# Patient Record
Sex: Female | Born: 1970 | Race: Black or African American | Hispanic: No | Marital: Married | Smoking: Never smoker
Health system: Southern US, Community
[De-identification: ages and names within clinical notes are randomized; demographics above are authoritative.]

## PROBLEM LIST (undated history)

## (undated) DIAGNOSIS — G4733 Obstructive sleep apnea (adult) (pediatric): Secondary | ICD-10-CM

## (undated) DIAGNOSIS — I420 Dilated cardiomyopathy: Secondary | ICD-10-CM

## (undated) DIAGNOSIS — I10 Essential (primary) hypertension: Secondary | ICD-10-CM

## (undated) DIAGNOSIS — I5021 Acute systolic (congestive) heart failure: Secondary | ICD-10-CM

## (undated) DIAGNOSIS — F419 Anxiety disorder, unspecified: Secondary | ICD-10-CM

## (undated) DIAGNOSIS — I5022 Chronic systolic (congestive) heart failure: Secondary | ICD-10-CM

## (undated) HISTORY — PX: FRACTURE SURGERY: SHX138

## (undated) HISTORY — PX: CHOLECYSTECTOMY: SHX55

## (undated) HISTORY — DX: Chronic systolic (congestive) heart failure: I50.22

## (undated) HISTORY — PX: OOPHORECTOMY: SHX86

## (undated) HISTORY — DX: Obstructive sleep apnea (adult) (pediatric): G47.33

## (undated) HISTORY — DX: Acute systolic (congestive) heart failure: I50.21

## (undated) HISTORY — DX: Dilated cardiomyopathy: I42.0

---

## 2011-11-17 ENCOUNTER — Ambulatory Visit: Payer: Self-pay | Admitting: Internal Medicine

## 2011-11-17 LAB — URINALYSIS, COMPLETE
Bilirubin,UR: NEGATIVE
Glucose,UR: NEGATIVE mg/dL (ref 0–75)
Ketone: NEGATIVE
Nitrite: POSITIVE
Ph: 6 (ref 4.5–8.0)
Protein: 100
Specific Gravity: 1.025 (ref 1.003–1.030)

## 2011-11-19 LAB — URINE CULTURE

## 2011-11-23 ENCOUNTER — Ambulatory Visit: Payer: Self-pay | Admitting: Family Medicine

## 2011-11-23 LAB — URINALYSIS, COMPLETE
Bilirubin,UR: NEGATIVE
Glucose,UR: NEGATIVE mg/dL (ref 0–75)
Ketone: NEGATIVE
Nitrite: NEGATIVE
Ph: 6 (ref 4.5–8.0)
Protein: 30
Specific Gravity: >=1.03 (ref 1.003–1.030)

## 2012-04-24 ENCOUNTER — Ambulatory Visit: Payer: Self-pay | Admitting: Internal Medicine

## 2013-11-20 ENCOUNTER — Ambulatory Visit: Payer: Self-pay | Admitting: Family Medicine

## 2014-01-04 DIAGNOSIS — E669 Obesity, unspecified: Secondary | ICD-10-CM | POA: Insufficient documentation

## 2014-01-04 HISTORY — DX: Obesity, unspecified: E66.9

## 2014-11-15 ENCOUNTER — Ambulatory Visit: Payer: Self-pay | Admitting: Family Medicine

## 2014-11-15 LAB — URINALYSIS, COMPLETE
Bilirubin,UR: NEGATIVE
Glucose,UR: NEGATIVE
Ketone: NEGATIVE
Nitrite: NEGATIVE
Ph: 5.5 (ref 5.0–8.0)
Protein: 100
Specific Gravity: >=1.03 (ref 1.000–1.030)

## 2014-11-19 LAB — URINE CULTURE

## 2015-03-05 ENCOUNTER — Ambulatory Visit
Admission: EM | Admit: 2015-03-05 | Discharge: 2015-03-05 | Disposition: A | Discharge status: Home / Self Care | Payer: BLUE CROSS/BLUE SHIELD | Attending: Family Medicine | Admitting: Family Medicine

## 2015-03-05 DIAGNOSIS — J40 Bronchitis, not specified as acute or chronic: Secondary | ICD-10-CM

## 2015-03-05 HISTORY — DX: Anxiety disorder, unspecified: F41.9

## 2015-03-05 MED ORDER — BENZONATATE 200 MG PO CAPS: 200.0000 mg | ORAL_CAPSULE | Freq: Three times a day (TID) | ORAL | Status: DC | PRN

## 2015-03-05 MED ORDER — AZITHROMYCIN 250 MG PO TABS: ORAL_TABLET | ORAL | Status: DC

## 2015-03-05 NOTE — ED Notes (Signed)
Patient states that she has been coughing. She states that she started getting sick 2 weeks ago. She states that the cough is dry and is almost choking at times.

## 2015-03-05 NOTE — ED Provider Notes (Signed)
CSN: [id number]     Arrival date & time 03/05/15  1532 History   First MD Initiated Contact with Patient 03/05/15 1616     Chief Complaint  Patient presents with  . Cough   Patient is a 44 y.o. female presenting with cough. The history is provided by the patient.  Cough Cough characteristics:  Non-productive Severity:  Moderate Onset quality:  Sudden Timing:  Intermittent Smoker: no   Context: upper respiratory infection (2 weeks ago)   Relieved by:  Cough suppressants (nyquil) Ineffective treatments:  Cough suppressants Associated symptoms: no chest pain, no chills, no ear pain, no fever, no rhinorrhea, no sinus congestion and no wheezing     Past Medical History  Diagnosis Date  . Anxiety    Past Surgical History  Procedure Laterality Date  . Oophorectomy Right    Family History  Problem Relation Age of Onset  . Congestive Heart Failure     History  Substance Use Topics  . Smoking status: Never Smoker   . Smokeless tobacco: Not on file  . Alcohol Use: No   OB History    No data available     Review of Systems  Constitutional: Negative for fever and chills.  HENT: Negative for ear pain and rhinorrhea.   Respiratory: Positive for cough. Negative for wheezing.   Cardiovascular: Negative for chest pain.    Allergies  Penicillins  Home Medications   Prior to Admission medications   Medication Sig Start Date End Date Taking? Authorizing Provider  lamoTRIgine (LAMICTAL) 200 MG tablet Take 300 mg by mouth at bedtime.   Yes Historical Provider, MD  LORazepam (ATIVAN) 0.5 MG tablet Take 0.5 mg by mouth as needed for anxiety.   Yes Historical Provider, MD  azithromycin (ZITHROMAX Z-PAK) 250 MG tablet 2 tabs po once on day 1, then 1 tab po qd for next 4 days 03/05/15   Payton Mccallum, MD  benzonatate (TESSALON) 200 MG capsule Take 1 capsule (200 mg total) by mouth 3 (three) times daily as needed for cough. 03/05/15   Payton Mccallum, MD   BP 136/93 mmHg  Pulse 84   Temp(Src) 97.6 F (36.4 C) (Oral)  Resp 18  Ht  (1.702 m)  Wt 240 lb (108.863 kg)  BMI 37.58 kg/m2  SpO2 98%  LMP 03/05/2015 Physical Exam  Constitutional: She appears well-developed and well-nourished. No distress.  HENT:  Head: Normocephalic and atraumatic.  Right Ear: Tympanic membrane, external ear and ear canal normal.  Left Ear: Tympanic membrane, external ear and ear canal normal.  Nose: Nose normal.  Mouth/Throat: Oropharynx is clear and moist and mucous membranes are normal. No oropharyngeal exudate.  Eyes: Conjunctivae and EOM are normal. Pupils are equal, round, and reactive to light. Right eye exhibits no discharge. Left eye exhibits no discharge. No scleral icterus.  Neck: Normal range of motion. Neck supple. No JVD present. No tracheal deviation present. No thyromegaly present.  Cardiovascular: Normal rate, regular rhythm, normal heart sounds and intact distal pulses.   No murmur heard. Pulmonary/Chest: Effort normal and breath sounds normal. No stridor. No respiratory distress. She has no wheezes. She has no rales.  Lymphadenopathy:    She has no cervical adenopathy.  Neurological: She is alert.  Skin: Skin is warm and dry. No rash noted. She is not diaphoretic. No erythema.  Psychiatric: She has a normal mood and affect. Her behavior is normal. Judgment and thought content normal.  Nursing note and vitals reviewed.   ED Course  Procedures (including critical care time) Labs Review Labs Reviewed - No data to display  Imaging Review No results found.   MDM   1. Bronchitis    Discharge Medication List as of 03/05/2015  4:26 PM    START taking these medications   Details  azithromycin (ZITHROMAX Z-PAK) 250 MG tablet 2 tabs po once on day 1, then 1 tab po qd for next 4 days, Normal    benzonatate (TESSALON) 200 MG capsule Take 1 capsule (200 mg total) by mouth 3 (three) times daily as needed for cough., Starting 03/05/2015, Until Discontinued, Normal       Plan: 1. Diagnosis reviewed with patient 2. rx as per orders; risks, benefits, potential side effects reviewed with patient 3. Recommend supportive treatment with increased fluids 4. F/u prn if symptoms worsen or don't improve    Payton Mccallum, MD 03/05/15 [phone]

## 2015-04-27 DIAGNOSIS — F411 Generalized anxiety disorder: Secondary | ICD-10-CM

## 2015-04-27 HISTORY — DX: Generalized anxiety disorder: F41.1

## 2015-05-02 DIAGNOSIS — I10 Essential (primary) hypertension: Secondary | ICD-10-CM

## 2015-05-02 HISTORY — DX: Essential (primary) hypertension: I10

## 2015-10-03 ENCOUNTER — Ambulatory Visit
Admission: EM | Admit: 2015-10-03 | Discharge: 2015-10-03 | Disposition: A | Discharge status: Home / Self Care | Payer: BLUE CROSS/BLUE SHIELD | Attending: Internal Medicine | Admitting: Internal Medicine

## 2015-10-03 DIAGNOSIS — J3089 Other allergic rhinitis: Secondary | ICD-10-CM | POA: Diagnosis not present

## 2015-10-03 DIAGNOSIS — H6983 Other specified disorders of Eustachian tube, bilateral: Secondary | ICD-10-CM | POA: Diagnosis not present

## 2015-10-03 HISTORY — DX: Essential (primary) hypertension: I10

## 2015-10-03 MED ORDER — FLUTICASONE PROPIONATE 50 MCG/ACT NA SUSP
2.0000 | Freq: Every day | NASAL | Status: DC
Start: 2015-10-03 — End: 2018-08-05

## 2015-10-03 MED ORDER — BENZONATATE 100 MG PO CAPS: 100.0000 mg | ORAL_CAPSULE | Freq: Three times a day (TID) | ORAL | Status: DC | PRN

## 2015-10-03 NOTE — ED Provider Notes (Signed)
CSN: [id number]     Arrival date & time 10/03/15  1520 History   First MD Initiated Contact with Patient 10/03/15 1539     Chief Complaint  Patient presents with  . Nasal Congestion    Pt with one week of URI sx including congestion, blowing bloody yellow-green secretions from nose, and ears ache. Pain in ears 2/10   (Consider location/radiation/quality/duration/timing/severity/associated sxs/prior Treatment) HPI  44 yo f with one week hx cough,cold ,nasal congestion and ear pressure. No fever , no malaise, good appetite. Concerned when she saw spots of blood in nasal discharge. Hx post nasal drip and non productive cough during the Fall. Had bronchitis last Spring and Dx possibility makes her anxious. Discussed viral /allergy component OTC cough/cold therapies not effective. Hx HTN. Currently on menses  Past Medical History  Diagnosis Date  . Anxiety   . HTN (hypertension)    Past Surgical History  Procedure Laterality Date  . Oophorectomy Right   . Cholecystectomy    . Oophorectomy    . Fracture surgery     Family History  Problem Relation Age of Onset  . Congestive Heart Failure     Social History  Substance Use Topics  . Smoking status: Never Smoker   . Smokeless tobacco: None  . Alcohol Use: No   OB History    No data available     Review of Systems Review of 10 systems negative for acute change except as referenced in HPI   Allergies  Penicillins  Home Medications   Prior to Admission medications   Medication Sig Start Date End Date Taking? Authorizing Provider  amLODipine (NORVASC) 10 MG tablet Take 10 mg by mouth daily.   Yes Historical Provider, MD  hydrochlorothiazide (HYDRODIURIL) 25 MG tablet Take 25 mg by mouth daily.   Yes Historical Provider, MD  azithromycin (ZITHROMAX Z-PAK) 250 MG tablet 2 tabs po once on day 1, then 1 tab po qd for next 4 days 03/05/15   Payton Mccallum, MD  benzonatate (TESSALON) 100 MG capsule Take 1 capsule (100 mg total) by  mouth 3 (three) times daily as needed. 10/03/15   Rae Halsted, PA-C  fluticasone [facility]) 50 MCG/ACT nasal spray Place 2 sprays into both nostrils daily. 10/03/15   Rae Halsted, PA-C  lamoTRIgine (LAMICTAL) 200 MG tablet Take 300 mg by mouth at bedtime.    Historical Provider, MD  LORazepam (ATIVAN) 0.5 MG tablet Take 0.5 mg by mouth as needed for anxiety.    Historical Provider, MD   Meds Ordered and Administered this Visit  Medications - No data to display  BP 136/78 mmHg  Pulse 89  Temp(Src) 98.2 F (36.8 C) (Oral)  Resp 20  Ht 5\' 7"  (1.702 m)  Wt 249 lb 6 oz (113.116 kg)  BMI 39.05 kg/m2  SpO2 100%  LMP 10/01/2015 No data found.   Physical Exam   VS noted, WNL  GENERAL : NAD HEENT: no pharyngeal erythema,no exudate, Ear canals neg; no erythema of TMs,mildly retracted w displaced light reflex; pop and squeak with valsalva; Conjugate gaze, no conjunctivitis; No pain w percussion sinues  no cervical LAD RESP: CTA  B , no wheezing, no accessory muscle use CARD: RRR ABD: Not distended NEURO: Good attention, good recall, no gross neuro defecit PSYCH: speech and behavior appropriate  ED Course  Procedures (including critical care time)  Labs Review Labs Reviewed - No data to display  Imaging Review No results found.  Discussed viral syndrome and  seasonal allergies Mucous and eustachian tube dysfunction Nasal membrane trauma , blood spots, minimal concern-add hydration,steam Add Zyrtec, Robitussin DM, Benzonatate, Fluticasone Ibuprofen 600 mg TID with meals for 3 days for discomfort and anti-inflammatory Consider continuing Zyrtec indefinitely if good response,  MDM   1. Eustachian tube dysfunction, bilateral   2. Environmental and seasonal allergies   Diagnosis and treatment discussed.  Questions fielded, expectations and recommendations reviewed. Discussed follow up and return parameters including no resolution or any worsening condition..  Patient expresses  understanding  and agrees to plan. Will return to [facility] with questions, concerns or exacerbation.    Rae Halsted, PA-C 10/08/15 [phone]

## 2015-10-03 NOTE — Discharge Instructions (Signed)
Add Zyrtec (ceterizine ) 1 tablet daily   Flonase  ( fluticasone) 1 spray per nostril per day  Benzonatate for cough  Robitussin DM 1 tsp every 6 hours   Fluids- steam - Barotitis Media Barotitis media is inflammation of your middle ear. This occurs when the auditory tube (eustachian tube) leading from the back of your nose (nasopharynx) to your eardrum is blocked. This blockage may result from a cold, environmental allergies, or an upper respiratory infection. Unresolved barotitis media may lead to damage or hearing loss (barotrauma), which may become permanent. HOME CARE INSTRUCTIONS   Use medicines as recommended by your health care provider. Over-the-counter medicines will help unblock the canal and can help during times of air travel.  Do not put anything into your ears to clean or unplug them. Eardrops will not be helpful.  Do not swim, dive, or fly until your health care provider says it is all right to do so. If these activities are necessary, chewing gum with frequent, forceful swallowing may help. It is also helpful to hold your nose and gently blow to pop your ears for equalizing pressure changes. This forces air into the eustachian tube.  Only take over-the-counter or prescription medicines for pain, discomfort, or fever as directed by your health care provider.  A decongestant may be helpful in decongesting the middle ear and make pressure equalization easier. SEEK MEDICAL CARE IF:  You experience a serious form of dizziness in which you feel as if the room is spinning and you feel nauseated (vertigo).  Your symptoms only involve one ear. SEEK IMMEDIATE MEDICAL CARE IF:   You develop a severe headache, dizziness, or severe ear pain.  You have bloody or pus-like drainage from your ears.  You develop a fever.  Your problems do not improve or become worse. MAKE SURE YOU:   Understand these instructions.  Will watch your condition.  Will get help right away if you  are not doing well or get worse.   This information is not intended to replace advice given to you by your health care provider. Make sure you discuss any questions you have with your health care provider.   Document Released: 09/30/2000 Document Revised: 07/24/2013 Document Reviewed: 04/30/2013 Elsevier Interactive Patient Education [employer]

## 2015-10-08 ENCOUNTER — Encounter: Payer: Self-pay | Admitting: Physician Assistant

## 2016-01-01 DIAGNOSIS — F3170 Bipolar disorder, currently in remission, most recent episode unspecified: Secondary | ICD-10-CM

## 2016-01-01 HISTORY — DX: Bipolar disorder, currently in remission, most recent episode unspecified: F31.70

## 2018-08-05 ENCOUNTER — Other Ambulatory Visit: Payer: Self-pay

## 2018-08-05 ENCOUNTER — Ambulatory Visit
Admission: EM | Admit: 2018-08-05 | Discharge: 2018-08-05 | Disposition: A | Discharge status: Home / Self Care | Payer: BLUE CROSS/BLUE SHIELD | Attending: Emergency Medicine | Admitting: Emergency Medicine

## 2018-08-05 ENCOUNTER — Encounter: Payer: Self-pay | Admitting: Gynecology

## 2018-08-05 DIAGNOSIS — A599 Trichomoniasis, unspecified: Secondary | ICD-10-CM

## 2018-08-05 DIAGNOSIS — N898 Other specified noninflammatory disorders of vagina: Secondary | ICD-10-CM | POA: Diagnosis not present

## 2018-08-05 DIAGNOSIS — Z113 Encounter for screening for infections with a predominantly sexual mode of transmission: Secondary | ICD-10-CM

## 2018-08-05 LAB — URINALYSIS, COMPLETE (UACMP) WITH MICROSCOPIC
Bilirubin Urine: NEGATIVE
Glucose, UA: NEGATIVE mg/dL
Nitrite: NEGATIVE
Protein, ur: 100 mg/dL — AB
Specific Gravity, Urine: 1.025 (ref 1.005–1.030)
pH: 5.5 (ref 5.0–8.0)

## 2018-08-05 LAB — WET PREP, GENITAL
Sperm: NONE SEEN
Yeast Wet Prep HPF POC: NONE SEEN

## 2018-08-05 LAB — CHLAMYDIA/NGC RT PCR (ARMC ONLY)
Chlamydia Tr: NOT DETECTED
N gonorrhoeae: NOT DETECTED

## 2018-08-05 MED ORDER — METRONIDAZOLE 500 MG PO TABS: 500.0000 mg | ORAL_TABLET | Freq: Two times a day (BID) | ORAL | 0 refills | Status: AC

## 2018-08-05 MED ORDER — CEFTRIAXONE SODIUM 250 MG IJ SOLR
250.0000 mg | Freq: Once | INTRAMUSCULAR | Status: AC
  Administered 2018-08-05: 250 mg via INTRAMUSCULAR

## 2018-08-05 MED ORDER — AZITHROMYCIN 500 MG PO TABS
1000.0000 mg | ORAL_TABLET | Freq: Once | ORAL | Status: AC
  Administered 2018-08-05: 1000 mg via ORAL

## 2018-08-05 NOTE — ED Provider Notes (Signed)
HPI  SUBJECTIVE:  Lisa Sellers is a 47 y.o. female who presents with 1 week of thick, white, odorous vaginal discharge, labial irritation and itching.  She used intravaginal and external Monistat with partial improvement in her symptoms, but then reports dysuria and continued irritation, discharge starting several days ago.  No genital rash.  No fevers, pelvic, abdominal, back pain.  No urinary urgency, frequency, cloudy or odorous urine, hematuria.  No recent antibiotics.  She wears non-perfumed panty liners on a regular basis for the past 6 months in case she has accidental urinary incontinence when she coughs or sneezes.  She does wear these while exercising, and states that she does not change out of her wet workout gear immediately after working out.  She is married to a female, who is asymptomatic, STDs are not a concern today.  She states this feels identical to previous yeast infections.  She has a past medical history of UTIs, yeast infections, hypertension that resolved with weight loss.  She is status post right oophorectomy.  No history of pyelonephritis, nephrolithiasis, gonorrhea, chlamydia, HIV, HSV, syphilis, Trichomonas, BV.  LMP: 2 weeks ago.  Denies possibility being pregnant.  WVP:XTGGY, Estill Cotta, MD   Past Medical History:  Diagnosis Date  . Anxiety   . HTN (hypertension)     Past Surgical History:  Procedure Laterality Date  . CHOLECYSTECTOMY    . FRACTURE SURGERY    . OOPHORECTOMY Right   . OOPHORECTOMY      Family History  Problem Relation Age of Onset  . Congestive Heart Failure Unknown     Social History   Tobacco Use  . Smoking status: Never Smoker  . Smokeless tobacco: Never Used  Substance Use Topics  . Alcohol use: No    Alcohol/week: 0.0 standard drinks  . Drug use: No    No current facility-administered medications for this encounter.   Current Outpatient Medications:  .  lamoTRIgine (LAMICTAL) 200 MG tablet, Take 300 mg by mouth at  bedtime., Disp: , Rfl:  .  LORazepam (ATIVAN) 0.5 MG tablet, Take 0.5 mg by mouth as needed for anxiety., Disp: , Rfl:  .  metroNIDAZOLE (FLAGYL) 500 MG tablet, Take 1 tablet (500 mg total) by mouth 2 (two) times daily for 7 days., Disp: 14 tablet, Rfl: 0  Allergies  Allergen Reactions  . Penicillins Hives     ROS  As noted in HPI.   Physical Exam  BP 130/88 (BP Location: Left Arm)   Pulse 80   Temp 97.9 F (36.6 C) (Oral)   Resp 18   Ht 5\' 7"  (1.702 m)   Wt 108.9 kg   LMP 07/22/2018   SpO2 98%   BMI 37.59 kg/m   Constitutional: Well developed, well nourished, no acute distress Eyes:  EOMI, conjunctiva normal bilaterally HENT: Normocephalic, atraumatic,mucus membranes moist Respiratory: Normal inspiratory effort Cardiovascular: Normal rate GI: nondistended soft, nontender. No suprapubic tenderness  GU: External genitalia erythematous, irritated.  No rash or ulcers.  Normal vaginal mucosa.  Normal os. thick  nonoderous  White vaginal discharge.  Uterus smooth,  NT. No CMT. No adnexal tenderness. No adnexal masses.  Chaperone present during exam skin: No rash, skin intact Musculoskeletal: no deformities Neurologic: Alert & oriented x 3, no focal neuro deficits Psychiatric: Speech and behavior appropriate   ED Course   Medications  azithromycin (ZITHROMAX) tablet 1,000 mg (1,000 mg Oral Given 08/05/18 1048)  cefTRIAXone (ROCEPHIN) injection 250 mg (250 mg Intramuscular Given 08/05/18 1050)  Orders Placed This Encounter  Procedures  . Chlamydia/NGC rt PCR    Standing Status:   Standing    Number of Occurrences:   1    Order Specific Question:   Patient immune status    Answer:   Normal  . Wet prep, genital    Standing Status:   Standing    Number of Occurrences:   1    Order Specific Question:   Patient immune status    Answer:   Normal  . Urinalysis, Complete w Microscopic    Standing Status:   Standing    Number of Occurrences:   1  . HIV antibody     Standing Status:   Standing    Number of Occurrences:   1  . RPR    Standing Status:   Standing    Number of Occurrences:   1    Results for orders placed or performed during the hospital encounter of 08/05/18 (from the past 24 hour(s))  Urinalysis, Complete w Microscopic     Status: Abnormal   Collection Time: 08/05/18  9:26 AM  Result Value Ref Range   Color, Urine YELLOW YELLOW   APPearance HAZY (A) CLEAR   Specific Gravity, Urine 1.025 1.005 - 1.030   pH 5.5 5.0 - 8.0   Glucose, UA NEGATIVE NEGATIVE mg/dL   Hgb urine dipstick TRACE (A) NEGATIVE   Bilirubin Urine NEGATIVE NEGATIVE   Ketones, ur TRACE (A) NEGATIVE mg/dL   Protein, ur 119 (A) NEGATIVE mg/dL   Nitrite NEGATIVE NEGATIVE   Leukocytes, UA TRACE (A) NEGATIVE   Squamous Epithelial / LPF 0-5 0 - 5   WBC, UA 11-20 0 - 5 WBC/hpf   RBC / HPF 0-5 0 - 5 RBC/hpf   Bacteria, UA RARE (A) NONE SEEN   Mucus PRESENT    Granular Casts, UA PRESENT   Wet prep, genital     Status: Abnormal   Collection Time: 08/05/18  9:49 AM  Result Value Ref Range   Yeast Wet Prep HPF POC NONE SEEN NONE SEEN   Trich, Wet Prep PRESENT (A) NONE SEEN   Clue Cells Wet Prep HPF POC PRESENT (A) NONE SEEN   WBC, Wet Prep HPF POC FEW (A) NONE SEEN   Sperm NONE SEEN    No results found.  ED Clinical Impression  Trichomonas infection  ED Assessment/Plan  UA noted however do not think that this is from a UTI rather from a vaginal infection. Wet prep positive for trichomonas.  Suspect the clue cells are from the trichomonas, rather than BV, however will send home with a 7-day prescription of Flagyl which will treat both.  Will treat empirically for gonorrhea and chlamydia.  Patient states that she had a rash to penicillin as a baby denies anaphylaxis.  Is willing to try Rocephin after discussing the risks and benefits of getting it.  Will observe after giving Rocephin.  Also checking for HIV, syphilis.  Will send home with Flagyl for the trichomonas.   handwrote a 7-day prescription for her husband as well.  Husband will need to be treated for gonorrhea and chlamydia if her results come back positive.  Consider treating for both even if one is negative.  Follow-up with PMD as needed. Discussed labs, MDM, plan and followup with patient. Pt agrees with plan.   Meds ordered this encounter  Medications  . azithromycin (ZITHROMAX) tablet 1,000 mg  . cefTRIAXone (ROCEPHIN) injection 250 mg  . metroNIDAZOLE (FLAGYL) 500 MG  tablet    Sig: Take 1 tablet (500 mg total) by mouth 2 (two) times daily for 7 days.    Dispense:  14 tablet    Refill:  0    *This clinic note was created using Scientist, clinical (histocompatibility and immunogenetics). Therefore, there may be occasional mistakes despite careful proofreading.  ?    Domenick Gong, MD 08/05/18 1743

## 2018-08-05 NOTE — ED Triage Notes (Signed)
Patient c/o burning with urination/ discharge and vaginal irritation x 1 week.

## 2018-08-05 NOTE — Discharge Instructions (Addendum)
We are treating you empirically for gonorrhea and chlamydia today.  Your partner will need to be treated if your labs come back positive for gonorrhea and chlamydia.  Make sure that he takes the Flagyl as well so that you both get treated.  No alcohol while taking Flagyl and for several days afterwards.

## 2018-08-07 ENCOUNTER — Telehealth: Payer: Self-pay | Admitting: Emergency Medicine

## 2018-08-07 LAB — RPR: RPR Ser Ql: NONREACTIVE

## 2018-08-07 LAB — HIV ANTIBODY (ROUTINE TESTING W REFLEX): HIV Screen 4th Generation wRfx: NONREACTIVE

## 2018-08-07 NOTE — Telephone Encounter (Signed)
Results are within normal range. Pt contacted and made aware. Verbalized understanding.   

## 2019-11-03 ENCOUNTER — Encounter (HOSPITAL_COMMUNITY): Payer: Self-pay | Admitting: Emergency Medicine

## 2019-11-03 ENCOUNTER — Emergency Department (HOSPITAL_COMMUNITY)
Admission: EM | Admit: 2019-11-03 | Discharge: 2019-11-03 | Disposition: A | Discharge status: Home / Self Care | Payer: BC Managed Care – PPO | Attending: Emergency Medicine | Admitting: Emergency Medicine

## 2019-11-03 ENCOUNTER — Emergency Department (HOSPITAL_COMMUNITY): Payer: BC Managed Care – PPO

## 2019-11-03 ENCOUNTER — Other Ambulatory Visit: Payer: Self-pay

## 2019-11-03 DIAGNOSIS — Z20822 Contact with and (suspected) exposure to covid-19: Secondary | ICD-10-CM | POA: Insufficient documentation

## 2019-11-03 DIAGNOSIS — R0602 Shortness of breath: Secondary | ICD-10-CM

## 2019-11-03 DIAGNOSIS — J189 Pneumonia, unspecified organism: Secondary | ICD-10-CM | POA: Diagnosis not present

## 2019-11-03 DIAGNOSIS — Z79899 Other long term (current) drug therapy: Secondary | ICD-10-CM | POA: Insufficient documentation

## 2019-11-03 DIAGNOSIS — I10 Essential (primary) hypertension: Secondary | ICD-10-CM | POA: Diagnosis not present

## 2019-11-03 LAB — CBC WITH DIFFERENTIAL/PLATELET
Abs Immature Granulocytes: 0.03 10*3/uL (ref 0.00–0.07)
Basophils Absolute: 0 10*3/uL (ref 0.0–0.1)
Basophils Relative: 0 %
Eosinophils Absolute: 0.1 10*3/uL (ref 0.0–0.5)
Eosinophils Relative: 1 %
HCT: 33 % — ABNORMAL LOW (ref 36.0–46.0)
Hemoglobin: 9.6 g/dL — ABNORMAL LOW (ref 12.0–15.0)
Immature Granulocytes: 0 %
Lymphocytes Relative: 29 %
Lymphs Abs: 2.6 10*3/uL (ref 0.7–4.0)
MCH: 23.2 pg — ABNORMAL LOW (ref 26.0–34.0)
MCHC: 29.1 g/dL — ABNORMAL LOW (ref 30.0–36.0)
MCV: 79.7 fL — ABNORMAL LOW (ref 80.0–100.0)
Monocytes Absolute: 0.7 10*3/uL (ref 0.1–1.0)
Monocytes Relative: 8 %
Neutro Abs: 5.5 10*3/uL (ref 1.7–7.7)
Neutrophils Relative %: 62 %
Platelets: 388 10*3/uL (ref 150–400)
RBC: 4.14 MIL/uL (ref 3.87–5.11)
RDW: 18.7 % — ABNORMAL HIGH (ref 11.5–15.5)
WBC: 9 10*3/uL (ref 4.0–10.5)
nRBC: 0 % (ref 0.0–0.2)

## 2019-11-03 LAB — BASIC METABOLIC PANEL
Anion gap: 8 (ref 5–15)
BUN: 16 mg/dL (ref 6–20)
CO2: 24 mmol/L (ref 22–32)
Calcium: 8.7 mg/dL — ABNORMAL LOW (ref 8.9–10.3)
Chloride: 106 mmol/L (ref 98–111)
Creatinine, Ser: 0.89 mg/dL (ref 0.44–1.00)
GFR calc Af Amer: >60 mL/min (ref >60)
GFR calc non Af Amer: >60 mL/min (ref >60)
Glucose, Bld: 158 mg/dL — ABNORMAL HIGH (ref 70–99)
Potassium: 3.8 mmol/L (ref 3.5–5.1)
Sodium: 138 mmol/L (ref 135–145)

## 2019-11-03 LAB — RESPIRATORY PANEL BY RT PCR (FLU A&B, COVID)
Influenza A by PCR: NEGATIVE
Influenza B by PCR: NEGATIVE
SARS Coronavirus 2 by RT PCR: NEGATIVE

## 2019-11-03 LAB — POC SARS CORONAVIRUS 2 AG -  ED: SARS Coronavirus 2 Ag: NEGATIVE

## 2019-11-03 MED ORDER — SODIUM CHLORIDE 0.9 % IV BOLUS
1000.0000 mL | Freq: Once | INTRAVENOUS | Status: AC
  Administered 2019-11-03: 1000 mL via INTRAVENOUS

## 2019-11-03 MED ORDER — SODIUM CHLORIDE 0.9 % IV SOLN
1.0000 g | Freq: Once | INTRAVENOUS | Status: AC
  Administered 2019-11-03: 1 g via INTRAVENOUS
  Filled 2019-11-03: qty 10

## 2019-11-03 MED ORDER — DOXYCYCLINE HYCLATE 100 MG PO TABS
100.0000 mg | ORAL_TABLET | Freq: Once | ORAL | Status: AC
  Administered 2019-11-03: 100 mg via ORAL
  Filled 2019-11-03: qty 1

## 2019-11-03 MED ORDER — LEVOFLOXACIN 500 MG PO TABS: 500.0000 mg | ORAL_TABLET | Freq: Every day | ORAL | 0 refills | Status: DC

## 2019-11-03 MED ORDER — LEVOFLOXACIN 750 MG PO TABS
750.0000 mg | ORAL_TABLET | Freq: Once | ORAL | Status: AC
  Administered 2019-11-03: 750 mg via ORAL
  Filled 2019-11-03: qty 1

## 2019-11-03 NOTE — ED Triage Notes (Signed)
Per patient, states she has had a cough and SOB since the 19 th-was seen at [facility] and given a steroid-states she has not gotten better-[facility] returned to [facility] and was told to come to the ED for more testing-has had covid test which was negative

## 2019-11-03 NOTE — Discharge Instructions (Signed)
The x-rays show that you have a community-acquired pneumonia. Your Covid test is negative.  We are starting you on antibiotics.  We recommend that you follow-up with your primary care doctor in 3 to 5 days.  Return to the ER immediately if you start having worsening shortness of breath, high fevers, confusion, fainting.

## 2019-11-03 NOTE — ED Notes (Signed)
An After Visit Summary was printed and given to the patient. Discharge instructions given and no further questions at this time.  

## 2019-11-03 NOTE — ED Provider Notes (Signed)
[facility]-EMERGENCY DEPT Provider Note   CSN: [id number] Arrival date & time: 11/03/19  1546     History Chief Complaint  Patient presents with  . Cough  . Shortness of Breath    Lisa Sellers is a 49 y.o. female with a past medical history of anxiety, hypertension presenting to the ED with a chief complaint of shortness of breath and cough.  Reports since December 29 has been having the similar symptoms.  She was seen and evaluated in urgent care with a negative rapid Covid test and a negative PCR test.  She was given steroids but her symptoms have not improved.  She was told to come to the ED after returning to the urgent care today.  She denies any chest pain, hemoptysis, leg swelling, history of DVT or PE.  HPI     Past Medical History:  Diagnosis Date  . Anxiety   . HTN (hypertension)     There are no problems to display for this patient.   Past Surgical History:  Procedure Laterality Date  . CHOLECYSTECTOMY    . FRACTURE SURGERY    . OOPHORECTOMY Right   . OOPHORECTOMY       OB History   No obstetric history on file.     Family History  Problem Relation Age of Onset  . Congestive Heart Failure Other     Social History   Tobacco Use  . Smoking status: Never Smoker  . Smokeless tobacco: Never Used  Substance Use Topics  . Alcohol use: No    Alcohol/week: 0.0 standard drinks  . Drug use: No    Home Medications Prior to Admission medications   Medication Sig Start Date End Date Taking? Authorizing Provider  lamoTRIgine (LAMICTAL) 200 MG tablet Take 300 mg by mouth at bedtime.    [provider]  LORazepam (ATIVAN) 0.5 MG tablet Take 0.5 mg by mouth as needed for anxiety.    [provider]    Allergies    Penicillins  Review of Systems   Review of Systems  Constitutional: Negative for appetite change, chills and fever.  HENT: Negative for ear pain, rhinorrhea, sneezing and sore throat.   Eyes:  Negative for photophobia and visual disturbance.  Respiratory: Positive for cough and shortness of breath. Negative for chest tightness and wheezing.   Cardiovascular: Negative for chest pain and palpitations.  Gastrointestinal: Negative for abdominal pain, blood in stool, constipation, diarrhea, nausea and vomiting.  Genitourinary: Negative for dysuria, hematuria and urgency.  Musculoskeletal: Negative for myalgias.  Skin: Negative for rash.  Neurological: Negative for dizziness, weakness and light-headedness.    Physical Exam Updated Vital Signs BP (!) 142/101 (BP Location: Left Arm)   Pulse (!) 112   Temp 98.3 F (36.8 C) (Oral)   Resp 18   Ht 5\' 7"  (1.702 m)   Wt 114.8 kg   SpO2 97%   BMI 39.63 kg/m   Physical Exam Vitals and nursing note reviewed.  Constitutional:      General: She is not in acute distress.    Appearance: She is well-developed.  HENT:     Head: Normocephalic and atraumatic.     Nose: Nose normal.  Eyes:     General: No scleral icterus.       Left eye: No discharge.     Conjunctiva/sclera: Conjunctivae normal.  Cardiovascular:     Rate and Rhythm: Regular rhythm. Tachycardia present.     Heart sounds: Normal heart sounds. No  murmur. No friction rub. No gallop.   Pulmonary:     Effort: Pulmonary effort is normal. Tachypnea present. No respiratory distress.     Breath sounds: Normal breath sounds.  Abdominal:     General: Bowel sounds are normal. There is no distension.     Palpations: Abdomen is soft.     Tenderness: There is no abdominal tenderness. There is no guarding.  Musculoskeletal:        General: Normal range of motion.     Cervical back: Normal range of motion and neck supple.     Right lower leg: No tenderness.     Left lower leg: No tenderness.  Skin:    General: Skin is warm and dry.     Findings: No rash.  Neurological:     Mental Status: She is alert.     Motor: No abnormal muscle tone.     Coordination: Coordination normal.       ED Results / Procedures / Treatments   Labs (all labs ordered are listed, but only abnormal results are displayed) Labs Reviewed  BASIC METABOLIC PANEL - Abnormal; Notable for the following components:      Result Value   Glucose, Bld 158 (*)    Calcium 8.7 (*)    All other components within normal limits  CBC WITH DIFFERENTIAL/PLATELET - Abnormal; Notable for the following components:   Hemoglobin 9.6 (*)    HCT 33.0 (*)    MCV 79.7 (*)    MCH 23.2 (*)    MCHC 29.1 (*)    RDW 18.7 (*)    All other components within normal limits  RESPIRATORY PANEL BY RT PCR (FLU A&B, COVID)  POC SARS CORONAVIRUS 2 AG -  ED    EKG None  Radiology DG Chest Port 1 View  Result Date: 11/03/2019 CLINICAL DATA:  Cough and shortness of breath since 10/05/2019 EXAM: PORTABLE CHEST 1 VIEW COMPARISON:  None. FINDINGS: Patchy basilar and peripheral predominant airspace opacities in both lungs with low volumes and atelectatic changes. Mild cardiomegaly, likely accentuated by low volumes and portable technique. No pneumothorax. No visible effusion. No acute osseous or soft tissue abnormality. IMPRESSION: Patchy basilar and peripheral predominant airspace opacities in both lungs. Differential considerations include pneumonia, pulmonary edema, and atelectasis. Electronically Signed   By: Lovena Le M.D.   On: 11/03/2019 17:04    Procedures Procedures (including critical care time)  Medications Ordered in ED Medications  cefTRIAXone (ROCEPHIN) 1 g in sodium chloride 0.9 % 100 mL IVPB (1 g Intravenous New Bag/Given 11/03/19 1737)  sodium chloride 0.9 % bolus 1,000 mL (1,000 mLs Intravenous New Bag/Given 11/03/19 1648)  doxycycline (VIBRA-TABS) tablet 100 mg (100 mg Oral Given 11/03/19 1739)    ED Course  I have reviewed the triage vital signs and the nursing notes.  Pertinent labs & imaging results that were available during my care of the patient were reviewed by me and considered in my medical  decision making (see chart for details).    MDM Rules/Calculators/A&P                      49 year old female presenting to the ED with essentially 2-1/2-week history of cough and shortness of breath.  She has had 2 - Covid test since her symptoms began.  No improvement noted with a steroid course.  She was sent to the ED for further evaluation after returning to urgent care today.  On exam patient is tachycardic, lungs  are clear bilaterally.  Oxygen saturations ranging from 92 to 97% on room air.  She does not appear in respiratory distress but does complain of generalized weakness and fatigue.  Will obtain repeat Covid test, chest x-ray, EKG, baseline lab work and reassess.  Chest x-ray shows basilar and peripheral airspace opacities concerning for pneumonia.  Will give Rocephin, doxycycline, wait on remainder of lab work.  Point-of-care Covid test is negative.  Of note, patient has an allergic reaction to penicillin (rash as a child).  However she has tolerated cephalosporins without difficulty per chart review. Care handed off to Dr. Rhunette Croft pending final disposition.  Final Clinical Impression(s) / ED Diagnoses Final diagnoses:  Shortness of breath    Rx / DC Orders ED Discharge Orders    None      Portions of this note were generated with Dragon dictation software. Dictation errors may occur despite best attempts at proofreading.    Dietrich Pates, PA-C 11/03/19 1745    Derwood Kaplan, MD 11/03/19 2202

## 2019-11-15 ENCOUNTER — Inpatient Hospital Stay (HOSPITAL_COMMUNITY)
Admit: 2019-11-15 | Discharge: 2019-11-15 | Disposition: A | Discharge status: Home / Self Care | Payer: BC Managed Care – PPO | Attending: Internal Medicine | Admitting: Internal Medicine

## 2019-11-15 ENCOUNTER — Emergency Department (HOSPITAL_COMMUNITY): Payer: BC Managed Care – PPO

## 2019-11-15 ENCOUNTER — Inpatient Hospital Stay (HOSPITAL_COMMUNITY): Payer: BC Managed Care – PPO

## 2019-11-15 ENCOUNTER — Inpatient Hospital Stay (HOSPITAL_COMMUNITY)
Admission: EM | Admit: 2019-11-15 | Discharge: 2019-11-19 | DRG: 286 | Disposition: A | Discharge status: Home / Self Care | Payer: BC Managed Care – PPO | Attending: Family Medicine | Admitting: Family Medicine

## 2019-11-15 ENCOUNTER — Other Ambulatory Visit: Payer: Self-pay

## 2019-11-15 ENCOUNTER — Encounter (HOSPITAL_COMMUNITY): Payer: Self-pay

## 2019-11-15 DIAGNOSIS — I5021 Acute systolic (congestive) heart failure: Secondary | ICD-10-CM | POA: Diagnosis present

## 2019-11-15 DIAGNOSIS — I10 Essential (primary) hypertension: Secondary | ICD-10-CM | POA: Diagnosis not present

## 2019-11-15 DIAGNOSIS — G4733 Obstructive sleep apnea (adult) (pediatric): Secondary | ICD-10-CM | POA: Diagnosis present

## 2019-11-15 DIAGNOSIS — R7989 Other specified abnormal findings of blood chemistry: Secondary | ICD-10-CM

## 2019-11-15 DIAGNOSIS — R7303 Prediabetes: Secondary | ICD-10-CM | POA: Diagnosis present

## 2019-11-15 DIAGNOSIS — Z6841 Body Mass Index (BMI) 40.0 and over, adult: Secondary | ICD-10-CM

## 2019-11-15 DIAGNOSIS — I5023 Acute on chronic systolic (congestive) heart failure: Secondary | ICD-10-CM | POA: Diagnosis not present

## 2019-11-15 DIAGNOSIS — Z8249 Family history of ischemic heart disease and other diseases of the circulatory system: Secondary | ICD-10-CM | POA: Diagnosis not present

## 2019-11-15 DIAGNOSIS — Z833 Family history of diabetes mellitus: Secondary | ICD-10-CM

## 2019-11-15 DIAGNOSIS — I428 Other cardiomyopathies: Secondary | ICD-10-CM | POA: Diagnosis present

## 2019-11-15 DIAGNOSIS — I509 Heart failure, unspecified: Secondary | ICD-10-CM

## 2019-11-15 DIAGNOSIS — J810 Acute pulmonary edema: Secondary | ICD-10-CM | POA: Diagnosis not present

## 2019-11-15 DIAGNOSIS — J9601 Acute respiratory failure with hypoxia: Secondary | ICD-10-CM | POA: Diagnosis present

## 2019-11-15 DIAGNOSIS — F419 Anxiety disorder, unspecified: Secondary | ICD-10-CM | POA: Diagnosis present

## 2019-11-15 DIAGNOSIS — I5041 Acute combined systolic (congestive) and diastolic (congestive) heart failure: Secondary | ICD-10-CM | POA: Diagnosis not present

## 2019-11-15 DIAGNOSIS — M7989 Other specified soft tissue disorders: Secondary | ICD-10-CM

## 2019-11-15 DIAGNOSIS — J189 Pneumonia, unspecified organism: Secondary | ICD-10-CM

## 2019-11-15 DIAGNOSIS — Z20822 Contact with and (suspected) exposure to covid-19: Secondary | ICD-10-CM | POA: Diagnosis present

## 2019-11-15 DIAGNOSIS — Z880 Allergy status to penicillin: Secondary | ICD-10-CM | POA: Diagnosis not present

## 2019-11-15 DIAGNOSIS — R0902 Hypoxemia: Secondary | ICD-10-CM

## 2019-11-15 DIAGNOSIS — I130 Hypertensive heart and chronic kidney disease with heart failure and stage 1 through stage 4 chronic kidney disease, or unspecified chronic kidney disease: Principal | ICD-10-CM | POA: Diagnosis present

## 2019-11-15 DIAGNOSIS — I340 Nonrheumatic mitral (valve) insufficiency: Secondary | ICD-10-CM

## 2019-11-15 DIAGNOSIS — R0602 Shortness of breath: Secondary | ICD-10-CM | POA: Diagnosis present

## 2019-11-15 DIAGNOSIS — N181 Chronic kidney disease, stage 1: Secondary | ICD-10-CM | POA: Diagnosis present

## 2019-11-15 HISTORY — DX: Other specified abnormal findings of blood chemistry: R79.89

## 2019-11-15 HISTORY — DX: Heart failure, unspecified: I50.9

## 2019-11-15 LAB — IRON AND TIBC
Iron: 27 ug/dL — ABNORMAL LOW (ref 28–170)
Saturation Ratios: 6 % — ABNORMAL LOW (ref 10.4–31.8)
TIBC: 472 ug/dL — ABNORMAL HIGH (ref 250–450)
UIBC: 445 ug/dL

## 2019-11-15 LAB — ECHOCARDIOGRAM COMPLETE
Height: 67 in
Weight: 4160 oz

## 2019-11-15 LAB — BASIC METABOLIC PANEL
Anion gap: 8 (ref 5–15)
BUN: 14 mg/dL (ref 6–20)
CO2: 25 mmol/L (ref 22–32)
Calcium: 8.6 mg/dL — ABNORMAL LOW (ref 8.9–10.3)
Chloride: 104 mmol/L (ref 98–111)
Creatinine, Ser: 0.91 mg/dL (ref 0.44–1.00)
GFR calc Af Amer: >60 mL/min (ref >60)
GFR calc non Af Amer: >60 mL/min (ref >60)
Glucose, Bld: 169 mg/dL — ABNORMAL HIGH (ref 70–99)
Potassium: 4 mmol/L (ref 3.5–5.1)
Sodium: 137 mmol/L (ref 135–145)

## 2019-11-15 LAB — D-DIMER, QUANTITATIVE: D-Dimer, Quant: 1.45 ug/mL-FEU — ABNORMAL HIGH (ref 0.00–0.50)

## 2019-11-15 LAB — CBC WITH DIFFERENTIAL/PLATELET
Abs Immature Granulocytes: 0.05 10*3/uL (ref 0.00–0.07)
Basophils Absolute: 0 10*3/uL (ref 0.0–0.1)
Basophils Relative: 0 %
Eosinophils Absolute: 0.1 10*3/uL (ref 0.0–0.5)
Eosinophils Relative: 1 %
HCT: 34.1 % — ABNORMAL LOW (ref 36.0–46.0)
Hemoglobin: 9.8 g/dL — ABNORMAL LOW (ref 12.0–15.0)
Immature Granulocytes: 1 %
Lymphocytes Relative: 21 %
Lymphs Abs: 1.7 10*3/uL (ref 0.7–4.0)
MCH: 23.3 pg — ABNORMAL LOW (ref 26.0–34.0)
MCHC: 28.7 g/dL — ABNORMAL LOW (ref 30.0–36.0)
MCV: 81 fL (ref 80.0–100.0)
Monocytes Absolute: 0.9 10*3/uL (ref 0.1–1.0)
Monocytes Relative: 11 %
Neutro Abs: 5.4 10*3/uL (ref 1.7–7.7)
Neutrophils Relative %: 66 %
Platelets: 372 10*3/uL (ref 150–400)
RBC: 4.21 MIL/uL (ref 3.87–5.11)
RDW: 19.4 % — ABNORMAL HIGH (ref 11.5–15.5)
WBC: 8.2 10*3/uL (ref 4.0–10.5)
nRBC: 0.7 % — ABNORMAL HIGH (ref 0.0–0.2)

## 2019-11-15 LAB — POC SARS CORONAVIRUS 2 AG -  ED: SARS Coronavirus 2 Ag: NEGATIVE

## 2019-11-15 LAB — FERRITIN: Ferritin: 11 ng/mL (ref 11–307)

## 2019-11-15 LAB — TSH: TSH: 3.123 u[IU]/mL (ref 0.350–4.500)

## 2019-11-15 LAB — MRSA PCR SCREENING: MRSA by PCR: NEGATIVE

## 2019-11-15 LAB — LACTIC ACID, PLASMA: Lactic Acid, Venous: 1.9 mmol/L (ref 0.5–1.9)

## 2019-11-15 LAB — BRAIN NATRIURETIC PEPTIDE: B Natriuretic Peptide: 383.3 pg/mL — ABNORMAL HIGH (ref 0.0–100.0)

## 2019-11-15 LAB — SARS CORONAVIRUS 2 (TAT 6-24 HRS): SARS Coronavirus 2: NEGATIVE

## 2019-11-15 LAB — CBG MONITORING, ED: Glucose-Capillary: 158 mg/dL — ABNORMAL HIGH (ref 70–99)

## 2019-11-15 MED ORDER — LAMOTRIGINE 100 MG PO TABS
400.0000 mg | ORAL_TABLET | Freq: Every day | ORAL | Status: DC
  Administered 2019-11-15 – 2019-11-18 (×4): 400 mg via ORAL
  Filled 2019-11-15 (×4): qty 4

## 2019-11-15 MED ORDER — GABAPENTIN 100 MG PO CAPS
100.0000 mg | ORAL_CAPSULE | Freq: Two times a day (BID) | ORAL | Status: DC
  Administered 2019-11-15 – 2019-11-19 (×8): 100 mg via ORAL
  Filled 2019-11-15 (×8): qty 1

## 2019-11-15 MED ORDER — IOHEXOL 350 MG/ML SOLN
100.0000 mL | Freq: Once | INTRAVENOUS | Status: AC | PRN
  Administered 2019-11-15: 11:00:00 100 mL via INTRAVENOUS

## 2019-11-15 MED ORDER — CHLORHEXIDINE GLUCONATE CLOTH 2 % EX PADS
6.0000 | MEDICATED_PAD | Freq: Every day | CUTANEOUS | Status: DC
  Administered 2019-11-17 – 2019-11-18 (×2): 6 via TOPICAL

## 2019-11-15 MED ORDER — LABETALOL HCL 5 MG/ML IV SOLN: 10.0000 mg | INTRAVENOUS | Status: DC | PRN

## 2019-11-15 MED ORDER — SODIUM CHLORIDE 0.9 % IV SOLN: INTRAVENOUS | Status: DC

## 2019-11-15 MED ORDER — SODIUM CHLORIDE (PF) 0.9 % IJ SOLN
INTRAMUSCULAR | Status: AC
  Filled 2019-11-15: qty 50

## 2019-11-15 MED ORDER — SODIUM CHLORIDE 0.9 % IV SOLN
1.0000 g | Freq: Once | INTRAVENOUS | Status: AC
  Administered 2019-11-15: 11:00:00 1 g via INTRAVENOUS
  Filled 2019-11-15: qty 10

## 2019-11-15 MED ORDER — ENOXAPARIN SODIUM 60 MG/0.6ML ~~LOC~~ SOLN
60.0000 mg | SUBCUTANEOUS | Status: DC
  Administered 2019-11-15 – 2019-11-17 (×2): 60 mg via SUBCUTANEOUS
  Filled 2019-11-15 (×4): qty 0.6

## 2019-11-15 MED ORDER — SODIUM CHLORIDE 0.9 % IV SOLN: 250.0000 mL | INTRAVENOUS | Status: DC | PRN

## 2019-11-15 MED ORDER — SODIUM CHLORIDE 0.9 % IV SOLN
500.0000 mg | Freq: Once | INTRAVENOUS | Status: AC
  Administered 2019-11-15: 12:00:00 500 mg via INTRAVENOUS
  Filled 2019-11-15: qty 500

## 2019-11-15 MED ORDER — LORAZEPAM 0.5 MG PO TABS: 0.5000 mg | ORAL_TABLET | Freq: Every day | ORAL | Status: DC | PRN

## 2019-11-15 MED ORDER — FUROSEMIDE 10 MG/ML IJ SOLN
60.0000 mg | Freq: Two times a day (BID) | INTRAMUSCULAR | Status: DC
  Administered 2019-11-15 – 2019-11-19 (×7): 60 mg via INTRAVENOUS
  Filled 2019-11-15 (×4): qty 6
  Filled 2019-11-15: qty 8
  Filled 2019-11-15 (×2): qty 6

## 2019-11-15 MED ORDER — SACUBITRIL-VALSARTAN 24-26 MG PO TABS
1.0000 | ORAL_TABLET | Freq: Two times a day (BID) | ORAL | Status: DC
  Administered 2019-11-15: 19:00:00 1 via ORAL
  Filled 2019-11-15: qty 1

## 2019-11-15 MED ORDER — LISINOPRIL 10 MG PO TABS
5.0000 mg | ORAL_TABLET | Freq: Every day | ORAL | Status: DC
  Administered 2019-11-15: 15:00:00 5 mg via ORAL
  Filled 2019-11-15: qty 1

## 2019-11-15 MED ORDER — SODIUM CHLORIDE 0.9% FLUSH: 3.0000 mL | INTRAVENOUS | Status: DC | PRN

## 2019-11-15 MED ORDER — SODIUM CHLORIDE 0.9% FLUSH
3.0000 mL | Freq: Two times a day (BID) | INTRAVENOUS | Status: DC
  Administered 2019-11-16 – 2019-11-19 (×4): 3 mL via INTRAVENOUS

## 2019-11-15 MED ORDER — SACUBITRIL-VALSARTAN 24-26 MG PO TABS
1.0000 | ORAL_TABLET | Freq: Two times a day (BID) | ORAL | Status: DC
  Administered 2019-11-17 – 2019-11-19 (×5): 1 via ORAL
  Filled 2019-11-15 (×6): qty 1

## 2019-11-15 MED ORDER — FUROSEMIDE 10 MG/ML IJ SOLN
40.0000 mg | Freq: Once | INTRAMUSCULAR | Status: AC
  Administered 2019-11-15: 10:00:00 40 mg via INTRAVENOUS
  Filled 2019-11-15: qty 4

## 2019-11-15 MED ORDER — ORAL CARE MOUTH RINSE
15.0000 mL | Freq: Two times a day (BID) | OROMUCOSAL | Status: DC
  Administered 2019-11-15 – 2019-11-19 (×5): 15 mL via OROMUCOSAL

## 2019-11-15 MED ORDER — ASPIRIN 81 MG PO CHEW: 81.0000 mg | CHEWABLE_TABLET | ORAL | Status: AC

## 2019-11-15 MED ORDER — SPIRONOLACTONE 12.5 MG HALF TABLET
12.5000 mg | ORAL_TABLET | Freq: Every day | ORAL | Status: DC
  Administered 2019-11-16 – 2019-11-19 (×4): 12.5 mg via ORAL
  Filled 2019-11-15 (×4): qty 1

## 2019-11-15 NOTE — Plan of Care (Signed)
  Problem: Clinical Measurements: Goal: Will remain free from infection Outcome: Progressing   Problem: Elimination: Goal: Will not experience complications related to bowel motility Outcome: Progressing   

## 2019-11-15 NOTE — Progress Notes (Signed)
Echocardiogram 2D Echocardiogram has been performed.  Lisa Sellers 11/15/2019, 1:55 PM   Notified Dr. Royann Shivers of stat result

## 2019-11-15 NOTE — Progress Notes (Signed)
Bilateral lower extremity venous duplex has been completed. Preliminary results can be found in CV Proc through chart review.   11/15/19 2:01 PM Olen Cordial RVT

## 2019-11-15 NOTE — Progress Notes (Signed)
PHARMACY - ENOXAPARIN  Pharmacy has been asked to adjust enoxaparin dosing as needed this patient for DVT prophylaxis.    Height: 67 inches Weight: 117.9 kg BMI: 40 CrCl ~ 100 ml/min  Assessment:  BMI > 30 and CrCl > 30 ml/min; will adjust Enoxaparin as recommended to 0.5 mg/kg/q24h  Plan: Enoxaparin 60mg  sq q24h for VTE prophylaxis  Thank you , PharmD

## 2019-11-15 NOTE — ED Notes (Addendum)
Patient visibly struggling to breathe while RN was in room and O2 dipped to 88% on RA.  RN put patient on 2L [place], 96% SpO2. Pt states she feels much better with the O2.

## 2019-11-15 NOTE — ED Notes (Signed)
Ultrasound at bedside

## 2019-11-15 NOTE — ED Notes (Signed)
Patient being transported to xray at this time 

## 2019-11-15 NOTE — ED Notes (Signed)
RN spoke to Dr. Renford Dills at this time. Patient OK to go to tele bed.

## 2019-11-15 NOTE — ED Notes (Signed)
RN explained to patient why provider wanted another covid swab and patient declined. RN told Greta Doom, PA-C at this time.

## 2019-11-15 NOTE — Consult Note (Signed)
Cardiology Consultation:   Patient ID: Lisa Sellers MRN: [id number]; DOB: 09/09/1971  Admit date: 11/15/2019 Date of Consult: 11/15/2019  Primary Care Provider: Chung, Aimee B, MD Primary Cardiologist: New Primary Electrophysiologist:  None    Patient Profile:   Lisa Sellers is a 48 y.o. female with a hx of HTN who is being seen today for the evaluation of CHF at the request of Dr Adhikari  History of Present Illness:   Lisa Sellers is a pleasant 48 yo BF with history of HTN but otherwise in good health. She reports first feeling ill on Dec 28 when she developed a cough and SOB. She went to urgent care and treated with steroids. She states she did not improve. She was seen in the ED here on January 22 with cough and SOB. She was afebrile. BP 142/101. CXR felt to be multifocal PNA and she was DC on antibiotics. She states she did seem to get better for a while but over the past 2-3 days has developed worsening cough, SOB and LE edema. No chest pain or palpitations. She is a non smoker and does not drink alcohol or use cocaine. No recent fever or chills. States she is prediabetic. She was on BP therapy in the past but came off medication when she lost weight.   Heart Pathway Score:     Past Medical History:  Diagnosis Date  . Anxiety   . HTN (hypertension)     Past Surgical History:  Procedure Laterality Date  . CHOLECYSTECTOMY    . FRACTURE SURGERY    . OOPHORECTOMY Right   . OOPHORECTOMY       Home Medications:  Prior to Admission medications   Medication Sig Start Date End Date Taking? Authorizing Provider  gabapentin (NEURONTIN) 100 MG capsule Take 100 mg by mouth 2 (two) times daily.   Yes [provider]  guaifenesin (ROBITUSSIN) 100 MG/5ML syrup Take 200 mg by mouth 3 (three) times daily as needed for cough.   Yes [provider]  lamoTRIgine (LAMICTAL) 200 MG tablet Take 400 mg by mouth at bedtime.    Yes [provider]    LORazepam (ATIVAN) 0.5 MG tablet Take 0.5 mg by mouth daily as needed for anxiety.    Yes [provider]  sodium chloride (OCEAN) 0.65 % SOLN nasal spray Place 1 spray into both nostrils as needed for congestion.   Yes [provider]  levofloxacin (LEVAQUIN) 500 MG tablet Take 1 tablet (500 mg total) by mouth daily. 11/04/19   Nanavati, Ankit, MD    Inpatient Medications: Scheduled Meds: . enoxaparin (LOVENOX) injection  60 mg Subcutaneous Q24H  . furosemide  60 mg Intravenous Q12H  . gabapentin  100 mg Oral BID  . lamoTRIgine  400 mg Oral QHS  . lisinopril  5 mg Oral Daily   Continuous Infusions:  PRN Meds: labetalol, LORazepam  Allergies:    Allergies  Allergen Reactions  . Penicillins Hives    Social History:   Social History   Socioeconomic History  . Marital status: Married    Spouse name: Not on file  . Number of children: 1  . Years of education: Not on file  . Highest education level: Not on file  Occupational History  . Not on file  Tobacco Use  . Smoking status: Never Smoker  . Smokeless tobacco: Never Used  Substance and Sexual Activity  . Alcohol use: No    Alcohol/week: 0.0 standard drinks  . Drug   use: No  . Sexual activity: Not on file  Other Topics Concern  . Not on file  Social History Narrative  . Not on file   Social Determinants of Health   Financial Resource Strain:   . Difficulty of Paying Living Expenses: Not on file  Food Insecurity:   . Worried About Running Out of Food in the Last Year: Not on file  . Ran Out of Food in the Last Year: Not on file  Transportation Needs:   . Lack of Transportation (Medical): Not on file  . Lack of Transportation (Non-Medical): Not on file  Physical Activity:   . Days of Exercise per Week: Not on file  . Minutes of Exercise per Session: Not on file  Stress:   . Feeling of Stress : Not on file  Social Connections:   . Frequency of Communication with Friends and Family: Not on  file  . Frequency of Social Gatherings with Friends and Family: Not on file  . Attends Religious Services: Not on file  . Active Member of Clubs or Organizations: Not on file  . Attends Club or Organization Meetings: Not on file  . Marital Status: Not on file  Intimate Partner Violence:   . Fear of Current or Ex-Partner: Not on file  . Emotionally Abused: Not on file  . Physically Abused: Not on file  . Sexually Abused: Not on file    Family History:    Family History  Problem Relation Age of Onset  . Heart failure Father   . Diabetes Father   . Congestive Heart Failure Other      ROS:  Please see the history of present illness.   All other ROS reviewed and negative.     Physical Exam/Data:   Vitals:   11/15/19 1245 11/15/19 1300 11/15/19 1330 11/15/19 1500  BP:  (!) 178/131 (!) 173/140 (!) 142/88  Pulse: 96 95 98 96  Resp: (!) 34 (!) 35 (!) 39 (!) 35  Temp:      TempSrc:      SpO2: 96% 96% 96% 96%  Weight:      Height:       No intake or output data in the 24 hours ending 11/15/19 1520 Last 3 Weights 11/15/2019 11/03/2019 08/05/2018  Weight (lbs) 260 lb 253 lb 240 lb  Weight (kg) 117.935 kg 114.76 kg 108.863 kg     Body mass index is 40.72 kg/m.  General:  Well nourished, well developed, in no acute distress HEENT: normal Lymph: no adenopathy Neck: neck is thick, unable to assess JVD Endocrine:  No thryomegaly Vascular: No carotid bruits; FA pulses 2+ bilaterally without bruits  Cardiac:  RRR with summation gallop, no murmur. No rub.  Lungs:  bibasilar rales  Abd: soft, nontender, no hepatomegaly  Ext: 2+ LE edema Musculoskeletal:  No deformities, BUE and BLE strength normal and equal Skin: warm and dry  Neuro:  CNs 2-12 intact, no focal abnormalities noted Psych:  Normal affect    EKG:  The EKG was personally reviewed and demonstrates:  NSR with prolonged QTc 492 msec. Otherwise normal.  Telemetry:  Telemetry was personally reviewed and demonstrates:   NSR  Relevant CV Studies: Echo today:IMPRESSIONS    1. Left ventricular ejection fraction, by visual estimation, is <20%. The  left ventricle has severely decreased function. There is no left  ventricular hypertrophy.  2. Elevated left atrial pressure.  3. Left ventricular diastolic parameters are consistent with Grade II  diastolic dysfunction (pseudonormalization).    4. Moderately dilated left ventricular internal cavity size.  5. The left ventricle demonstrates global hypokinesis.  6. Global right ventricle has mildly reduced systolic function.The right  ventricular size is normal. No increase in right ventricular wall  thickness.  7. Left atrial size was moderately dilated.  8. The pericardial effusion is circumferential.  9. Trivial pericardial effusion is present.  10. The mitral valve is normal in structure. Mild mitral valve  regurgitation.  11. The tricuspid valve is normal in structure. Tricuspid valve  regurgitation is trivial.  12. The aortic valve is normal in structure. Aortic valve regurgitation is  not visualized.  13. The pulmonic valve was normal in structure. Pulmonic valve  regurgitation is not visualized.  14. TR signal is inadequate for assessing pulmonary artery systolic  pressure.  15. The inferior vena cava is dilated in size with <50% respiratory  variability, suggesting right atrial pressure of 15 mmHg.  16. The average left ventricular global longitudinal strain is -6.6 %.  17. Right atrial size was normal.   LE venous dopplers negative for DVT.     Laboratory Data:  High Sensitivity Troponin:  No results for input(s): TROPONINIHS in the last 720 hours.   Chemistry Recent Labs  Lab 11/15/19 0749  NA 137  K 4.0  CL 104  CO2 25  GLUCOSE 169*  BUN 14  CREATININE 0.91  CALCIUM 8.6*  GFRNONAA >60  GFRAA >60  ANIONGAP 8    No results for input(s): PROT, ALBUMIN, AST, ALT, ALKPHOS, BILITOT in the last 168 hours. HematologyNo  results for input(s): WBC, RBC, HGB, HCT, MCV, MCH, MCHC, RDW, PLT in the last 168 hours. BNP Recent Labs  Lab 11/15/19 0749  BNP 383.3*    DDimer  Recent Labs  Lab 11/15/19 0749  DDIMER 1.45*     Radiology/Studies:  DG Chest 2 View  Result Date: 11/15/2019 CLINICAL DATA:  Recent pneumonia.  Shortness of breath. EXAM: CHEST - 2 VIEW COMPARISON:  11/03/2019 FINDINGS: Grossly unchanged enlarged cardiac silhouette and mediastinal contours. Progressive rather diffuse though perihilar predominant interstitial thickening. Minimal bibasilar heterogeneous opacities favored to represent atelectasis. No definite pleural effusion or pneumothorax. No acute osseous abnormalities. IMPRESSION: Progressive bilateral perihilar interstitial opacities, potentially pulmonary edema though atypical infection (including COVID-19) could have a similar appearance. Electronically Signed   By: John  Watts M.D.   On: 11/15/2019 07:44   CT Angio Chest PE W and/or Wo Contrast  Result Date: 11/15/2019 CLINICAL DATA:  Onset shortness of breath yesterday. Oxygen desaturation. The patient reports she was discharged from the hospital 11/08/2019 after admission for pneumonia. EXAM: CT ANGIOGRAPHY CHEST WITH CONTRAST TECHNIQUE: Multidetector CT imaging of the chest was performed using the standard protocol during bolus administration of intravenous contrast. Multiplanar CT image reconstructions and MIPs were obtained to evaluate the vascular anatomy. CONTRAST:  100 mL OMNIPAQUE IOHEXOL 350 MG/ML SOLN COMPARISON:  Single-view of the chest 11/03/2019. PA and lateral chest today. FINDINGS: Cardiovascular: No pulmonary embolus is identified. There is marked cardiomegaly. Minimal aortic atherosclerosis is seen. No aortic aneurysm or pericardial effusion. Mediastinum/Nodes: No enlarged mediastinal, hilar, or axillary lymph nodes. Thyroid gland, trachea, and esophagus demonstrate no significant findings. Lungs/Pleura: Small to moderate  bilateral pleural effusions are present, greater on the right. There is scattered interlobular septal thickening and ground-glass attenuation. Mild basilar atelectasis noted. Upper Abdomen: Status post cholecystectomy. No acute or focal abnormality. Musculoskeletal: No lytic or sclerotic lesion. No acute bony abnormality. Review of the MIP images confirms the above   findings. IMPRESSION: Negative for pulmonary embolus. Findings consistent with pulmonary edema with associated small to moderate pleural effusions, larger on the right. Marked cardiomegaly also noted. Mild aortic atherosclerosis (ICD10-I70.0). Electronically Signed   By: Thomas  Dalessio M.D.   On: 11/15/2019 11:30   ECHOCARDIOGRAM COMPLETE  Result Date: 11/15/2019   ECHOCARDIOGRAM REPORT   Patient Name:   Lisa Sellers Date of Exam: 11/15/2019 Medical Rec #:  [id number]               Height:       67.0 in Accession #:    [id number]              Weight:       260.0 lb Date of Birth:  10/10/1971               BSA:          2.26 m Patient Age:    48 years                BP:           178/131 mmHg Patient Gender: F                       HR:           98 bpm. Exam Location:  Inpatient Procedure: 2D Echo, 3D Echo, Color Doppler, Cardiac Doppler and Strain Analysis Indications:    R06.9 DOE  History:        Patient has prior history of Echocardiogram examinations, most                 recent 04/21/2018. CHF; Risk Factors:Hypertension. Prior echo                 performed at [facility].  Sonographer:    Emily Senior RDCS Referring Phys: [id number] AMRIT ADHIKARI IMPRESSIONS  1. Left ventricular ejection fraction, by visual estimation, is <20%. The left ventricle has severely decreased function. There is no left ventricular hypertrophy.  2. Elevated left atrial pressure.  3. Left ventricular diastolic parameters are consistent with Grade II diastolic dysfunction (pseudonormalization).  4. Moderately dilated left ventricular internal cavity size.  5. The left  ventricle demonstrates global hypokinesis.  6. Global right ventricle has mildly reduced systolic function.The right ventricular size is normal. No increase in right ventricular wall thickness.  7. Left atrial size was moderately dilated.  8. The pericardial effusion is circumferential.  9. Trivial pericardial effusion is present. 10. The mitral valve is normal in structure. Mild mitral valve regurgitation. 11. The tricuspid valve is normal in structure. Tricuspid valve regurgitation is trivial. 12. The aortic valve is normal in structure. Aortic valve regurgitation is not visualized. 13. The pulmonic valve was normal in structure. Pulmonic valve regurgitation is not visualized. 14. TR signal is inadequate for assessing pulmonary artery systolic pressure. 15. The inferior vena cava is dilated in size with <50% respiratory variability, suggesting right atrial pressure of 15 mmHg. 16. The average left ventricular global longitudinal strain is -6.6 %. 17. Right atrial size was normal. FINDINGS  Left Ventricle: Left ventricular ejection fraction, by visual estimation, is <20%. The left ventricle has severely decreased function. The average left ventricular global longitudinal strain is -6.6 %. The left ventricle demonstrates global hypokinesis.  The left ventricular internal cavity size was moderately dilated left ventricle. There is no left ventricular hypertrophy. Left ventricular diastolic parameters are consistent with Grade II diastolic dysfunction (pseudonormalization). Elevated left atrial   pressure. Right Ventricle: The right ventricular size is normal. No increase in right ventricular wall thickness. Global RV systolic function is has mildly reduced systolic function. Left Atrium: Left atrial size was moderately dilated. Right Atrium: Right atrial size was normal in size Pericardium: Trivial pericardial effusion is present. The pericardial effusion is circumferential. Mitral Valve: The mitral valve is normal in  structure. Mild mitral valve regurgitation, with centrally-directed jet. Tricuspid Valve: The tricuspid valve is normal in structure. Tricuspid valve regurgitation is trivial. Aortic Valve: The aortic valve is normal in structure. Aortic valve regurgitation is not visualized. Pulmonic Valve: The pulmonic valve was normal in structure. Pulmonic valve regurgitation is not visualized. Pulmonic regurgitation is not visualized. Aorta: The aortic root and ascending aorta are structurally normal, with no evidence of dilitation. Venous: The inferior vena cava is dilated in size with less than 50% respiratory variability, suggesting right atrial pressure of 15 mmHg. IAS/Shunts: No atrial level shunt detected by color flow Doppler.  LEFT VENTRICLE PLAX 2D LVIDd:         6.20 cm       Diastology LVIDs:         5.90 cm       LV e' lateral:   5.66 cm/s LV PW:         1.30 cm       LV E/e' lateral: 17.0 LV IVS:        1.00 cm       LV e' medial:    6.85 cm/s LVOT diam:     2.00 cm       LV E/e' medial:  14.1 LV SV:         21 ml LV SV Index:   8.59          2D Longitudinal Strain LVOT Area:     3.14 cm      2D Strain GLS Avg:     -6.6 %  LV Volumes (MOD) LV area d, A2C:    46.30 cm LV area d, A4C:    43.00 cm LV area s, A2C:    42.50 cm LV area s, A4C:    36.00 cm LV major d, A2C:   9.54 cm LV major d, A4C:   9.83 cm LV major s, A2C:   9.38 cm LV major s, A4C:   9.22 cm LV vol d, MOD A2C: 190.0 ml LV vol d, MOD A4C: 160.0 ml LV vol s, MOD A2C: 162.0 ml LV vol s, MOD A4C: 126.0 ml LV SV MOD A2C:     28.0 ml LV SV MOD A4C:     160.0 ml LV SV MOD BP:      33.8 ml RIGHT VENTRICLE RV S prime:     8.92 cm/s TAPSE (M-mode): 2.1 cm LEFT ATRIUM             Index       RIGHT ATRIUM           Index LA diam:        4.10 cm 1.81 cm/m  RA Area:     15.40 cm LA Vol (A2C):   86.1 ml 38.08 ml/m RA Volume:   35.10 ml  15.53 ml/m LA Vol (A4C):   81.6 ml 36.09 ml/m LA Biplane Vol: 85.6 ml 37.86 ml/m  AORTIC VALVE LVOT Vmax:   47.00 cm/s  LVOT Vmean:  32.500 cm/s LVOT VTI:    0.066 m  AORTA Ao Root diam: 2.70 cm Ao Asc   diam:  3.10 cm MITRAL VALVE MV Area (PHT): 4.21 cm             SHUNTS MV PHT:        52.20 msec           Systemic VTI:  0.07 m MV Decel Time: 180 msec             Systemic Diam: 2.00 cm MV E velocity: 96.40 cm/s 103 cm/s MV A velocity: 28.70 cm/s 70.3 cm/s MV E/A ratio:  3.36       1.5  Mihai Croitoru MD Electronically signed by Mihai Croitoru MD Signature Date/Time: 11/15/2019/2:13:57 PM    Final    VAS US LOWER EXTREMITY VENOUS (DVT)  Result Date: 11/15/2019  Lower Venous Study Indications: Swelling.  Risk Factors: None identified. Limitations: Body habitus and poor ultrasound/tissue interface. Comparison Study: No prior studies. Performing Technologist: Gregory Collins RVT  Examination Guidelines: A complete evaluation includes B-mode imaging, spectral Doppler, color Doppler, and power Doppler as needed of all accessible portions of each vessel. Bilateral testing is considered an integral part of a complete examination. Limited examinations for reoccurring indications may be performed as noted.  +---------+---------------+---------+-----------+----------+--------------+ RIGHT    CompressibilityPhasicitySpontaneityPropertiesThrombus Aging +---------+---------------+---------+-----------+----------+--------------+ CFV      Full           Yes      Yes                                 +---------+---------------+---------+-----------+----------+--------------+ SFJ      Full                                                        +---------+---------------+---------+-----------+----------+--------------+ FV Prox  Full                                                        +---------+---------------+---------+-----------+----------+--------------+ FV Mid   Full                                                        +---------+---------------+---------+-----------+----------+--------------+ FV  DistalFull                                                        +---------+---------------+---------+-----------+----------+--------------+ PFV      Full                                                        +---------+---------------+---------+-----------+----------+--------------+ POP      Full           Yes      Yes                                 +---------+---------------+---------+-----------+----------+--------------+   PTV      Full                                                        +---------+---------------+---------+-----------+----------+--------------+ PERO     Full                                                        +---------+---------------+---------+-----------+----------+--------------+   +---------+---------------+---------+-----------+----------+--------------+ LEFT     CompressibilityPhasicitySpontaneityPropertiesThrombus Aging +---------+---------------+---------+-----------+----------+--------------+ CFV      Full           Yes      Yes                                 +---------+---------------+---------+-----------+----------+--------------+ SFJ      Full                                                        +---------+---------------+---------+-----------+----------+--------------+ FV Prox  Full                                                        +---------+---------------+---------+-----------+----------+--------------+ FV Mid   Full                                                        +---------+---------------+---------+-----------+----------+--------------+ FV DistalFull                                                        +---------+---------------+---------+-----------+----------+--------------+ PFV      Full                                                        +---------+---------------+---------+-----------+----------+--------------+ POP      Full           Yes      Yes                                  +---------+---------------+---------+-----------+----------+--------------+ PTV      Full                                                        +---------+---------------+---------+-----------+----------+--------------+   PERO     Full                                                        +---------+---------------+---------+-----------+----------+--------------+     Summary: Right: There is no evidence of deep vein thrombosis in the lower extremity. No cystic structure found in the popliteal fossa. Left: There is no evidence of deep vein thrombosis in the lower extremity. No cystic structure found in the popliteal fossa.  *See table(s) above for measurements and observations.    Preliminary          Assessment and Plan:   1. Acute systolic CHF. Patient presents with one month history of dyspnea and cough and now edema. EF 15% by Echo with severe global HK. BNP 383. CT c/w pulmonary edema and small pleural effusions.  Most likely this is related to untreated HTN although could be viral mediated or familial. Recommend IV diuresis with lasix 40 mg bid. Strict I/O, daily weight. Sodium restriction. Will add Entresto and spironolactone. Once volume status is improved would add Coreg. Titrate meds as tolerated. Recommend right and left heart cath on Monday if stable.  2. HTN uncontrolled. Will monitor response to above medication.        For questions or updates, please contact [facility] Please consult [email or web address] for contact info under     Signed, Lynnae Ludemann, MD  11/15/2019 3:20 PM 

## 2019-11-15 NOTE — ED Notes (Signed)
Patient given bedside commode at this time. CT on the way to get patient for scan... RN will give IV antibiotics when patient returns from CT.

## 2019-11-15 NOTE — Plan of Care (Signed)
  Problem: Safety: Goal: Ability to remain free from injury will improve Outcome: Progressing   Problem: Education: Goal: Ability to verbalize understanding of medication therapies will improve Outcome: Progressing   Problem: Coping: Goal: Level of anxiety will decrease Outcome: Progressing

## 2019-11-15 NOTE — H&P (Signed)
History and Physical    Lisa Sellers BPZ:[id number] DOB: Aug 21, 1971 DOA: 11/15/2019  PCP: Catha Gosselin, MD   Patient coming from: Home    Chief Complaint: Shortness of breath, orthopnea, cough  HPI: Lisa Sellers is a 49 y.o. female with medical history significant of hypertension, anxiety disorder, morbid obesity, borderline diabetes who presents from home to the emergency department with complaints of persistent shortness of breath, lower extremity edema, orthopnea.  She has history of hypertension but apparently is not taking any antihypertensive medicines at home.  Patient started feeling short of breath, started coughing about a month ago on 29 December.  She was seen at urgent care given steroid tablets after she was diagnosed with upper respiratory infection.  That did not help.  She was seen in the emergency department here about 10 days ago, chest x-ray at that time showed multifocal pneumonia and she was discharged on Levaquin.  She has been tested multiple times for Covid recently and all of them are negative.  She felt little better after she took Levaquin but since yesterday she again  started having shortness of breath and now orthopneic.  She is also coughing and bringing some phlegm.  She has noticed that her bilateral lower extremities are swollen.  Patient lives with her family.  No history of Covid exposure. Patient seen and examined at the bedside in the emergency department.  She was hypertensive.  She was not in any respiratory distress and states she is feeling slightly better now.  She denies any fever, chills, chest pain, abdomen, nausea, vomiting, diarrhea, dysuria or headache.   ED Course: Chest x-ray done in the emergency department showed bilateral perihilar interstitial opacities suggesting pulmonary edema versus atypical infection.  CT angio of the chest showed pulmonary edema, small-to-moderate bilateral pleural effusion.  Covid screen test is  pending. E KG showed sinus rhythm.  BNP limited  Review of Systems: As per HPI otherwise 10 point review of systems negative.    Past Medical History:  Diagnosis Date  . Anxiety   . HTN (hypertension)     Past Surgical History:  Procedure Laterality Date  . CHOLECYSTECTOMY    . FRACTURE SURGERY    . OOPHORECTOMY Right   . OOPHORECTOMY       reports that she has never smoked. She has never used smokeless tobacco. She reports that she does not drink alcohol or use drugs.  Allergies  Allergen Reactions  . Penicillins Hives    Family History  Problem Relation Age of Onset  . Congestive Heart Failure Other      Prior to Admission medications   Medication Sig Start Date End Date Taking? Authorizing Provider  gabapentin (NEURONTIN) 100 MG capsule Take 100 mg by mouth 2 (two) times daily.   Yes [provider]  guaifenesin (ROBITUSSIN) 100 MG/5ML syrup Take 200 mg by mouth 3 (three) times daily as needed for cough.   Yes [provider]  lamoTRIgine (LAMICTAL) 200 MG tablet Take 400 mg by mouth at bedtime.    Yes [provider]  LORazepam (ATIVAN) 0.5 MG tablet Take 0.5 mg by mouth daily as needed for anxiety.    Yes [provider]  sodium chloride (OCEAN) 0.65 % SOLN nasal spray Place 1 spray into both nostrils as needed for congestion.   Yes [provider]  levofloxacin (LEVAQUIN) 500 MG tablet Take 1 tablet (500 mg total) by mouth daily. 11/04/19   Derwood Kaplan, MD  Physical Exam: Vitals:   11/15/19 1000 11/15/19 1110 11/15/19 1120 11/15/19 1130  BP: (!) 162/120 (!) 192/130  (!) 169/128  Pulse: 93  94 98  Resp: (!) 33 (!) 30 (!) 29 (!) 28  Temp:      TempSrc:      SpO2: 98%  97% 96%  Weight:      Height:        Constitutional: Not in distress, morbidly obese Vitals:   11/15/19 1000 11/15/19 1110 11/15/19 1120 11/15/19 1130  BP: (!) 162/120 (!) 192/130  (!) 169/128  Pulse: 93  94 98  Resp: (!) 33 (!) 30 (!) 29  (!) 28  Temp:      TempSrc:      SpO2: 98%  97% 96%  Weight:      Height:       Eyes: PERRL, lids and conjunctivae normal Neck: normal, supple, no masses, no thyromegaly Respiratory: Decreased air entry in the bases, faint crackles normal respiratory effort. No accessory muscle use.  Cardiovascular: Hypertensive, regular rate and rhythm, no murmurs / rubs / gallops.  2-3+ bilateral lower extremity edema Abdomen: no tenderness, no masses palpated. No hepatosplenomegaly. Bowel sounds positive.  Musculoskeletal: no clubbing / cyanosis. No joint deformity upper and lower extremities. Good ROM, no contractures. Normal muscle tone.  Skin: no rashes, lesions, ulcers. No induration Neurologic: CN 2-12 grossly intact. Strength 5/5 in all 4.  Psychiatric: Normal judgment and insight. Alert and oriented x 3. Normal mood.   Foley Catheter:None  Labs on Admission: I have personally reviewed following labs and imaging studies  CBC: No results for input(s): WBC, NEUTROABS, HGB, HCT, MCV, PLT in the last 168 hours. Basic Metabolic Panel: Recent Labs  Lab 11/15/19 0749  NA 137  K 4.0  CL 104  CO2 25  GLUCOSE 169*  BUN 14  CREATININE 0.91  CALCIUM 8.6*   GFR: Estimated Creatinine Clearance: 100.4 mL/min (by C-G formula based on SCr of 0.91 mg/dL). Liver Function Tests: No results for input(s): AST, ALT, ALKPHOS, BILITOT, PROT, ALBUMIN in the last 168 hours. No results for input(s): LIPASE, AMYLASE in the last 168 hours. No results for input(s): AMMONIA in the last 168 hours. Coagulation Profile: No results for input(s): INR, PROTIME in the last 168 hours. Cardiac Enzymes: No results for input(s): CKTOTAL, CKMB, CKMBINDEX, TROPONINI in the last 168 hours. BNP (last 3 results) No results for input(s): PROBNP in the last 8760 hours. HbA1C: No results for input(s): HGBA1C in the last 72 hours. CBG: Recent Labs  Lab 11/15/19 0627  GLUCAP 158*   Lipid Profile: No results for  input(s): CHOL, HDL, LDLCALC, TRIG, CHOLHDL, LDLDIRECT in the last 72 hours. Thyroid Function Tests: No results for input(s): TSH, T4TOTAL, FREET4, T3FREE, THYROIDAB in the last 72 hours. Anemia Panel: No results for input(s): VITAMINB12, FOLATE, FERRITIN, TIBC, IRON, RETICCTPCT in the last 72 hours. Urine analysis:    Component Value Date/Time   COLORURINE YELLOW 08/05/2018 0926   APPEARANCEUR HAZY (A) 08/05/2018 0926   APPEARANCEUR HAZY 11/15/2014 1452   LABSPEC 1.025 08/05/2018 0926   LABSPEC >=1.030 11/15/2014 1452   PHURINE 5.5 08/05/2018 0926   GLUCOSEU NEGATIVE 08/05/2018 0926   GLUCOSEU NEGATIVE 11/15/2014 1452   HGBUR TRACE (A) 08/05/2018 0926   BILIRUBINUR NEGATIVE 08/05/2018 0926   BILIRUBINUR NEGATIVE 11/15/2014 1452   KETONESUR TRACE (A) 08/05/2018 0926   PROTEINUR 100 (A) 08/05/2018 0926   NITRITE NEGATIVE 08/05/2018 0926   LEUKOCYTESUR TRACE (A) 08/05/2018 4034  LEUKOCYTESUR TRACE 11/15/2014 1452    Radiological Exams on Admission: DG Chest 2 View  Result Date: 11/15/2019 CLINICAL DATA:  Recent pneumonia.  Shortness of breath. EXAM: CHEST - 2 VIEW COMPARISON:  11/03/2019 FINDINGS: Grossly unchanged enlarged cardiac silhouette and mediastinal contours. Progressive rather diffuse though perihilar predominant interstitial thickening. Minimal bibasilar heterogeneous opacities favored to represent atelectasis. No definite pleural effusion or pneumothorax. No acute osseous abnormalities. IMPRESSION: Progressive bilateral perihilar interstitial opacities, potentially pulmonary edema though atypical infection (including COVID-19) could have a similar appearance. Electronically Signed   By: Simonne Come M.D.   On: 11/15/2019 07:44   CT Angio Chest PE W and/or Wo Contrast  Result Date: 11/15/2019 CLINICAL DATA:  Onset shortness of breath yesterday. Oxygen desaturation. The patient reports she was discharged from the hospital 11/08/2019 after admission for pneumonia. EXAM: CT  ANGIOGRAPHY CHEST WITH CONTRAST TECHNIQUE: Multidetector CT imaging of the chest was performed using the standard protocol during bolus administration of intravenous contrast. Multiplanar CT image reconstructions and MIPs were obtained to evaluate the vascular anatomy. CONTRAST:  100 mL OMNIPAQUE IOHEXOL 350 MG/ML SOLN COMPARISON:  Single-view of the chest 11/03/2019. PA and lateral chest today. FINDINGS: Cardiovascular: No pulmonary embolus is identified. There is marked cardiomegaly. Minimal aortic atherosclerosis is seen. No aortic aneurysm or pericardial effusion. Mediastinum/Nodes: No enlarged mediastinal, hilar, or axillary lymph nodes. Thyroid gland, trachea, and esophagus demonstrate no significant findings. Lungs/Pleura: Small to moderate bilateral pleural effusions are present, greater on the right. There is scattered interlobular septal thickening and ground-glass attenuation. Mild basilar atelectasis noted. Upper Abdomen: Status post cholecystectomy. No acute or focal abnormality. Musculoskeletal: No lytic or sclerotic lesion. No acute bony abnormality. Review of the MIP images confirms the above findings. IMPRESSION: Negative for pulmonary embolus. Findings consistent with pulmonary edema with associated small to moderate pleural effusions, larger on the right. Marked cardiomegaly also noted. Mild aortic atherosclerosis (ICD10-I70.0). Electronically Signed   By: Drusilla Kanner M.D.   On: 11/15/2019 11:30     Assessment/Plan Principal Problem:   Acute CHF (congestive heart failure) (HCC) Active Problems:   Elevated brain natriuretic peptide (BNP) level   HTN (hypertension)   Anxiety   Acute exacerbation of CHF (congestive heart failure) (HCC)   New onset acute congestive heart failure: Her presenting symptoms are suggestive of new onset congestive heart failure.  Will order echocardiogram.  Elevated BNP.  She was hypertensive on presentation.  Flash pulmonary edema is also  possibility. Started on Lasix 60 mg IV every 12 hr for now.  Monitor input/output, daily weight.  EKG showed normal sinus rhythm.  Acute hypoxic respiratory failure: Desaturated on room air.  Chest imaging showed pulmonary edema.  Continue 2 L of oxygen per minute.  Anticipate improvement with diuresis  Uncontrolled hypertension: Severely hypertensive on presentation.  Not on medicines at home.  Continue diuretics which will help with the blood pressure.  Started on low-dose lisinopril.  Continue as needed meds.  Follow-up echocardiogram.  Elevated D-dimer: CT angio did not show pulmonary embolism.  Has bilateral lower extremity edema most likely due to congestive heart failure.  Rule out DVT.  Will check venous duplex  Anxiety: Home medicines resumed.  Microcytic anemia: Labs on 11/03/2019 showed hemoglobin of 9.6 with MCV of 79.7.  We will check iron studies.  New CBC pending.   Severity of Illness: The appropriate patient status for this patient is INPATIENT. Inpatient status is judged to be reasonable and necessary in order to provide the required intensity of  service to ensure the patient's safety. The patient's presenting symptoms, physical exam findings, and initial radiographic and laboratory data in the context of their chronic comorbidities is felt to place them at high risk for further clinical deterioration. Furthermore, it is not anticipated that the patient will be medically stable for discharge from the hospital within 2 midnights of admission. The following factors support the patient status of inpatient.   " The patient's presenting symptoms include dyspnea, acute respiratory failure. " The worrisome physical exam findings include congestive heart failure"  The initial radiographic and laboratory data are worrisome because of pulmonary edema " The chronic co-morbidities include hypertension.   * I certify that at the point of admission it is my clinical judgment that the  patient will require inpatient hospital care spanning beyond 2 midnights from the point of admission due to high intensity of service, high risk for further deterioration and high frequency of surveillance required.*    DVT prophylaxis: Lovenox Code Status: Full Family Communication: None present at the bedside Consults called: None     Burnadette Pop MD [facility] Pager [phone]  If 7PM-7AM, please contact night-coverage [email or web address] Password [email or web address]  11/15/2019, 1:13 PM

## 2019-11-15 NOTE — Progress Notes (Signed)
Received report from [place], [place], after reviewing chart, pt has elevated BP and has been elevated since admitted, pt has increased respirations and ECHO findings is <20%. [place] Citizen stated MD made aware. Will talk over with 4th floor unit CN. SRP,RN

## 2019-11-15 NOTE — ED Notes (Signed)
Ultrasound told RN that she will call a cardiologist because patient's EF <25%. RN paged admitting doctor, Renford Dills, at this time regarding patient going to a telemetry bed.

## 2019-11-15 NOTE — ED Provider Notes (Addendum)
[facility] DEPT Provider Note   CSN: [id number] Arrival date & time: 11/15/19  0600     History Chief Complaint  Patient presents with  . Shortness of Breath    Lisa Sellers is a 49 y.o. female.  The history is provided by the patient and medical records. No language interpreter was used.  Shortness of Breath    49 year old female with history of hypertension and recently diagnosed for pneumonia presenting here for evaluation of shortness of breath.  Patient reports 2 weeks ago she developed cough productive with clear sputum, shortness of breath, and generalized fatigue.  She was seen in the ED, tested negative for COVID-19 however chest x-ray shows multifocal pneumonia.  She was placed on Levaquin as treatment.  She finished a 10-day course of the medication and felt better.  However within the past 2 days her symptoms exacerbates again.  She endorsed increased shortness of breath with occasional cough.  Shortness of breath increased with exertion as well as laying flat.  She also noticed some swelling to her legs for the past few days.  She does not complain of any significant chest pain, body aches, loss of taste or smell, nausea vomiting diarrhea, hemoptysis or calf swelling.  No prior history of PE or DVT, does not take oral hormone, no recent surgery, prolonged bedrest or recent travel.  No recent sick contact with anyone with COVID-19.  She denies tobacco or alcohol use.  She mention her blood pressure has been elevated and her primary care doctor will be addressing that.  She is not on any blood pressure medication.  Past Medical History:  Diagnosis Date  . Anxiety   . HTN (hypertension)     There are no problems to display for this patient.   Past Surgical History:  Procedure Laterality Date  . CHOLECYSTECTOMY    . FRACTURE SURGERY    . OOPHORECTOMY Right   . OOPHORECTOMY       OB History   No obstetric history on file.      Family History  Problem Relation Age of Onset  . Congestive Heart Failure Other     Social History   Tobacco Use  . Smoking status: Never Smoker  . Smokeless tobacco: Never Used  Substance Use Topics  . Alcohol use: No    Alcohol/week: 0.0 standard drinks  . Drug use: No    Home Medications Prior to Admission medications   Medication Sig Start Date End Date Taking? Authorizing Provider  lamoTRIgine (LAMICTAL) 200 MG tablet Take 300 mg by mouth at bedtime.    [provider]  levofloxacin (LEVAQUIN) 500 MG tablet Take 1 tablet (500 mg total) by mouth daily. 11/04/19   Varney Biles, MD  LORazepam (ATIVAN) 0.5 MG tablet Take 0.5 mg by mouth as needed for anxiety.    [provider]    Allergies    Penicillins  Review of Systems   Review of Systems  Respiratory: Positive for shortness of breath.   All other systems reviewed and are negative.   Physical Exam Updated Vital Signs BP (!) 192/118 (BP Location: Left Arm)   Pulse (!) 108   Temp 98.8 F (37.1 C) (Oral)   Resp (!) 24   Ht 5\' 7"  (1.702 m)   Wt 117.9 kg   SpO2 92%   BMI 40.72 kg/m   Physical Exam Vitals and nursing note reviewed.  Constitutional:      General: She is not in acute  distress.    Appearance: She is well-developed.  HENT:     Head: Atraumatic.  Eyes:     Conjunctiva/sclera: Conjunctivae normal.  Neck:     Comments: No JVD Cardiovascular:     Rate and Rhythm: Tachycardia present.  Pulmonary:     Breath sounds: Decreased breath sounds and rales (Faint crackles heard at the lung bases) present.  Musculoskeletal:     Cervical back: Neck supple.     Right lower leg: Edema present.     Left lower leg: Edema present.     Comments: 2+ pitting edema to bilateral lower extremities.  Leg compartment is soft and nontender.  Dorsalis pedis pulse palpable.  Skin:    Findings: No rash.  Neurological:     Mental Status: She is alert and oriented to person, place, and time.   Psychiatric:        Mood and Affect: Mood normal.     ED Results / Procedures / Treatments   Labs (all labs ordered are listed, but only abnormal results are displayed) Labs Reviewed  BASIC METABOLIC PANEL - Abnormal; Notable for the following components:      Result Value   Glucose, Bld 169 (*)    Calcium 8.6 (*)    All other components within normal limits  BRAIN NATRIURETIC PEPTIDE - Abnormal; Notable for the following components:   B Natriuretic Peptide 383.3 (*)    All other components within normal limits  D-DIMER, QUANTITATIVE (NOT AT [facility]) - Abnormal; Notable for the following components:   D-Dimer, Quant 1.45 (*)    All other components within normal limits  CBG MONITORING, ED - Abnormal; Notable for the following components:   Glucose-Capillary 158 (*)    All other components within normal limits  SARS CORONAVIRUS 2 (TAT 6-24 HRS)  LACTIC ACID, PLASMA  POC SARS CORONAVIRUS 2 AG -  ED    EKG EKG Interpretation  Date/Time:  Friday November 15 2019 07:40:25 EST Ventricular Rate:  99 PR Interval:    QRS Duration: 96 QT Interval:  383 QTC Calculation: 492 R Axis:   83 Text Interpretation: Sinus rhythm Borderline prolonged QT interval Confirmed by Margarita Grizzle [id number]) on 11/15/2019 11:14:01 AM   ED ECG REPORT   Date: 11/15/2019  Rate: 99  Rhythm: normal sinus rhythm  QRS Axis: normal  Intervals: borderline prolonged QT  ST/T Wave abnormalities: normal  Conduction Disutrbances:none  Narrative Interpretation:   Old EKG Reviewed: unchanged  I have personally reviewed the EKG tracing and agree with the computerized printout as noted.   Radiology DG Chest 2 View  Result Date: 11/15/2019 CLINICAL DATA:  Recent pneumonia.  Shortness of breath. EXAM: CHEST - 2 VIEW COMPARISON:  11/03/2019 FINDINGS: Grossly unchanged enlarged cardiac silhouette and mediastinal contours. Progressive rather diffuse though perihilar predominant interstitial thickening. Minimal  bibasilar heterogeneous opacities favored to represent atelectasis. No definite pleural effusion or pneumothorax. No acute osseous abnormalities. IMPRESSION: Progressive bilateral perihilar interstitial opacities, potentially pulmonary edema though atypical infection (including COVID-19) could have a similar appearance. Electronically Signed   By: Simonne Come M.D.   On: 11/15/2019 07:44   CT Angio Chest PE W and/or Wo Contrast  Result Date: 11/15/2019 CLINICAL DATA:  Onset shortness of breath yesterday. Oxygen desaturation. The patient reports she was discharged from the hospital 11/08/2019 after admission for pneumonia. EXAM: CT ANGIOGRAPHY CHEST WITH CONTRAST TECHNIQUE: Multidetector CT imaging of the chest was performed using the standard protocol during bolus administration of intravenous contrast. Multiplanar CT  image reconstructions and MIPs were obtained to evaluate the vascular anatomy. CONTRAST:  100 mL OMNIPAQUE IOHEXOL 350 MG/ML SOLN COMPARISON:  Single-view of the chest 11/03/2019. PA and lateral chest today. FINDINGS: Cardiovascular: No pulmonary embolus is identified. There is marked cardiomegaly. Minimal aortic atherosclerosis is seen. No aortic aneurysm or pericardial effusion. Mediastinum/Nodes: No enlarged mediastinal, hilar, or axillary lymph nodes. Thyroid gland, trachea, and esophagus demonstrate no significant findings. Lungs/Pleura: Small to moderate bilateral pleural effusions are present, greater on the right. There is scattered interlobular septal thickening and ground-glass attenuation. Mild basilar atelectasis noted. Upper Abdomen: Status post cholecystectomy. No acute or focal abnormality. Musculoskeletal: No lytic or sclerotic lesion. No acute bony abnormality. Review of the MIP images confirms the above findings. IMPRESSION: Negative for pulmonary embolus. Findings consistent with pulmonary edema with associated small to moderate pleural effusions, larger on the right. Marked  cardiomegaly also noted. Mild aortic atherosclerosis (ICD10-I70.0). Electronically Signed   By: Drusilla Kanner M.D.   On: 11/15/2019 11:30    Procedures .Critical Care Performed by: Fayrene Helper, PA-C Authorized by: Fayrene Helper, PA-C   Critical care provider statement:    Critical care time (minutes):  36   Critical care was time spent personally by me on the following activities:  Discussions with consultants, evaluation of patient's response to treatment, examination of patient, ordering and performing treatments and interventions, ordering and review of laboratory studies, ordering and review of radiographic studies, pulse oximetry, re-evaluation of patient's condition, obtaining history from patient or surrogate and review of old charts   (including critical care time)  Medications Ordered in ED Medications  azithromycin (ZITHROMAX) 500 mg in sodium chloride 0.9 % 250 mL IVPB (has no administration in time range)  sodium chloride (PF) 0.9 % injection (has no administration in time range)  cefTRIAXone (ROCEPHIN) 1 g in sodium chloride 0.9 % 100 mL IVPB (1 g Intravenous New Bag/Given 11/15/19 1119)  furosemide (LASIX) injection 40 mg (40 mg Intravenous Given 11/15/19 0955)  iohexol (OMNIPAQUE) 350 MG/ML injection 100 mL (100 mLs Intravenous Contrast Given 11/15/19 1102)    ED Course  I have reviewed the triage vital signs and the nursing notes.  Pertinent labs & imaging results that were available during my care of the patient were reviewed by me and considered in my medical decision making (see chart for details).    MDM Rules/Calculators/A&P                      BP (!) 157/126   Pulse 87   Temp 98.8 F (37.1 C) (Oral)   Resp (!) 27   Ht 5\' 7"  (1.702 m)   Wt 117.9 kg   SpO2 98%   BMI 40.72 kg/m   Final Clinical Impression(s) / ED Diagnoses Final diagnoses:  Acute on chronic congestive heart failure, unspecified heart failure type (HCC)  Suspected COVID-19 virus  infection  Hypoxia    Rx / DC Orders ED Discharge Orders    None     7:18 AM Patient previously diagnosed with multifocal pneumonia approximately 2 weeks ago, just finished 10-day course of Levaquin approximately 4 days ago is here with worsening shortness of breath for the past 2 days.  On exam, she is mildly tachypneic, does have evidence of edema to bilateral lower extremities and faint crackles at lung base.  She is afebrile however was found to be hypertensive initially with a blood pressure of 192/118 as well as tachycardia with a heart rate of 108.  Her initial O2 sats is 92%'s on room air.  Will recheck vital sign, work-up initiated.  9:41 AM O2 sats 88% on room air, improved with 2 L of O2.  Patient is tachypneic upon engaging conversation.  Labs remarkable for an elevated BNP of 383.3 and elevated D-dimer of 1.45.  Chest x-ray shows progressive bilateral perihilar interstitial opacity, potentially pulmonary edema though atypical infection including COVID-19 could have similar appearance.  In the setting of worsening EKG changes, hypoxia, and negative COVID-19 test, will initiate IV antibiotic and will consult for admission.  I will also obtain chest CTA to rule out PE.  Lasix given for fluid retention, concerning for potential underlying CHF.  11:58 AM Chest CT angiogram showed no evidence of PE however evidence of mild pulmonary edema with associated small to moderate pleural effusion large on the right with marked cardiomegaly.  Will consult for admission  12:13 PM Appreciate consultation from [facility] who agrees to see and admit pt for further management of her hypoxia, likely due to new onset CHF with potential underlying COVID-19 infection.    Emnet Monk was evaluated in Emergency Department on 11/15/2019 for the symptoms described in the history of present illness. She was evaluated in the context of the global COVID-19 pandemic, which necessitated  consideration that the patient might be at risk for infection with the SARS-CoV-2 virus that causes COVID-19. Institutional protocols and algorithms that pertain to the evaluation of patients at risk for COVID-19 are in a state of rapid change based on information released by regulatory bodies including the CDC and federal and state organizations. These policies and algorithms were followed during the patient's care in the ED.    Fayrene Helper, PA-C 11/15/19 1246    Margarita Grizzle, MD 11/16/19 1504  ADDENDUM: CC time added   Fayrene Helper, PA-C 11/28/19 6160    Margarita Grizzle, MD 11/28/19 1341

## 2019-11-15 NOTE — H&P (View-Only) (Signed)
Cardiology Consultation:   Patient ID: Lisa Sellers MRN: [id number]; DOB: [birth year]  Admit date: 11/15/2019 Date of Consult: 11/15/2019  Primary Care Provider: Catha Gosselin, MD Primary Cardiologist: New Primary Electrophysiologist:  None    Patient Profile:   Lisa Sellers is a 49 y.o. female with a hx of HTN who is being seen today for the evaluation of CHF at the request of Dr Renford Dills  History of Present Illness:   Lisa Sellers is a pleasant 49 yo BF with history of HTN but otherwise in good health. She reports first feeling ill on Dec 28 when she developed a cough and SOB. She went to urgent care and treated with steroids. She states she did not improve. She was seen in the ED here on January 22 with cough and SOB. She was afebrile. BP 142/101. CXR felt to be multifocal PNA and she was DC on antibiotics. She states she did seem to get better for a while but over the past 2-3 days has developed worsening cough, SOB and LE edema. No chest pain or palpitations. She is a non smoker and does not drink alcohol or use cocaine. No recent fever or chills. States she is prediabetic. She was on BP therapy in the past but came off medication when she lost weight.   Heart Pathway Score:     Past Medical History:  Diagnosis Date  . Anxiety   . HTN (hypertension)     Past Surgical History:  Procedure Laterality Date  . CHOLECYSTECTOMY    . FRACTURE SURGERY    . OOPHORECTOMY Right   . OOPHORECTOMY       Home Medications:  Prior to Admission medications   Medication Sig Start Date End Date Taking? Authorizing Provider  gabapentin (NEURONTIN) 100 MG capsule Take 100 mg by mouth 2 (two) times daily.   Yes [provider]  guaifenesin (ROBITUSSIN) 100 MG/5ML syrup Take 200 mg by mouth 3 (three) times daily as needed for cough.   Yes [provider]  lamoTRIgine (LAMICTAL) 200 MG tablet Take 400 mg by mouth at bedtime.    Yes [provider]    LORazepam (ATIVAN) 0.5 MG tablet Take 0.5 mg by mouth daily as needed for anxiety.    Yes [provider]  sodium chloride (OCEAN) 0.65 % SOLN nasal spray Place 1 spray into both nostrils as needed for congestion.   Yes [provider]  levofloxacin (LEVAQUIN) 500 MG tablet Take 1 tablet (500 mg total) by mouth daily. 11/04/19   Derwood Kaplan, MD    Inpatient Medications: Scheduled Meds: . enoxaparin (LOVENOX) injection  60 mg Subcutaneous Q24H  . furosemide  60 mg Intravenous Q12H  . gabapentin  100 mg Oral BID  . lamoTRIgine  400 mg Oral QHS  . lisinopril  5 mg Oral Daily   Continuous Infusions:  PRN Meds: labetalol, LORazepam  Allergies:    Allergies  Allergen Reactions  . Penicillins Hives    Social History:   Social History   Socioeconomic History  . Marital status: Married    Spouse name: Not on file  . Number of children: 1  . Years of education: Not on file  . Highest education level: Not on file  Occupational History  . Not on file  Tobacco Use  . Smoking status: Never Smoker  . Smokeless tobacco: Never Used  Substance and Sexual Activity  . Alcohol use: No    Alcohol/week: 0.0 standard drinks  . Drug  use: No  . Sexual activity: Not on file  Other Topics Concern  . Not on file  Social History Narrative  . Not on file   Social Determinants of Health   Financial Resource Strain:   . Difficulty of Paying Living Expenses: Not on file  Food Insecurity:   . Worried About Programme researcher, broadcasting/film/video in the Last Year: Not on file  . Ran Out of Food in the Last Year: Not on file  Transportation Needs:   . Lack of Transportation (Medical): Not on file  . Lack of Transportation (Non-Medical): Not on file  Physical Activity:   . Days of Exercise per Week: Not on file  . Minutes of Exercise per Session: Not on file  Stress:   . Feeling of Stress : Not on file  Social Connections:   . Frequency of Communication with Friends and Family: Not on  file  . Frequency of Social Gatherings with Friends and Family: Not on file  . Attends Religious Services: Not on file  . Active Member of Clubs or Organizations: Not on file  . Attends Banker Meetings: Not on file  . Marital Status: Not on file  Intimate Partner Violence:   . Fear of Current or Ex-Partner: Not on file  . Emotionally Abused: Not on file  . Physically Abused: Not on file  . Sexually Abused: Not on file    Family History:    Family History  Problem Relation Age of Onset  . Heart failure Father   . Diabetes Father   . Congestive Heart Failure Other      ROS:  Please see the history of present illness.   All other ROS reviewed and negative.     Physical Exam/Data:   Vitals:   11/15/19 1245 11/15/19 1300 11/15/19 1330 11/15/19 1500  BP:  (!) 178/131 (!) 173/140 (!) 142/88  Pulse: 96 95 98 96  Resp: (!) 34 (!) 35 (!) 39 (!) 35  Temp:      TempSrc:      SpO2: 96% 96% 96% 96%  Weight:      Height:       No intake or output data in the 24 hours ending 11/15/19 1520 Last 3 Weights 11/15/2019 11/03/2019 08/05/2018  Weight (lbs) 260 lb 253 lb 240 lb  Weight (kg) 117.935 kg 114.76 kg 108.863 kg     Body mass index is 40.72 kg/m.  General:  Well nourished, well developed, in no acute distress HEENT: normal Lymph: no adenopathy Neck: neck is thick, unable to assess JVD Endocrine:  No thryomegaly Vascular: No carotid bruits; FA pulses 2+ bilaterally without bruits  Cardiac:  RRR with summation gallop, no murmur. No rub.  Lungs:  bibasilar rales  Abd: soft, nontender, no hepatomegaly  Ext: 2+ LE edema Musculoskeletal:  No deformities, BUE and BLE strength normal and equal Skin: warm and dry  Neuro:  CNs 2-12 intact, no focal abnormalities noted Psych:  Normal affect    EKG:  The EKG was personally reviewed and demonstrates:  NSR with prolonged QTc 492 msec. Otherwise normal.  Telemetry:  Telemetry was personally reviewed and demonstrates:   NSR  Relevant CV Studies: Echo today:IMPRESSIONS    1. Left ventricular ejection fraction, by visual estimation, is <20%. The  left ventricle has severely decreased function. There is no left  ventricular hypertrophy.  2. Elevated left atrial pressure.  3. Left ventricular diastolic parameters are consistent with Grade II  diastolic dysfunction (pseudonormalization).  4. Moderately dilated left ventricular internal cavity size.  5. The left ventricle demonstrates global hypokinesis.  6. Global right ventricle has mildly reduced systolic function.The right  ventricular size is normal. No increase in right ventricular wall  thickness.  7. Left atrial size was moderately dilated.  8. The pericardial effusion is circumferential.  9. Trivial pericardial effusion is present.  10. The mitral valve is normal in structure. Mild mitral valve  regurgitation.  11. The tricuspid valve is normal in structure. Tricuspid valve  regurgitation is trivial.  12. The aortic valve is normal in structure. Aortic valve regurgitation is  not visualized.  13. The pulmonic valve was normal in structure. Pulmonic valve  regurgitation is not visualized.  14. TR signal is inadequate for assessing pulmonary artery systolic  pressure.  15. The inferior vena cava is dilated in size with <50% respiratory  variability, suggesting right atrial pressure of 15 mmHg.  16. The average left ventricular global longitudinal strain is -6.6 %.  17. Right atrial size was normal.   LE venous dopplers negative for DVT.     Laboratory Data:  High Sensitivity Troponin:  No results for input(s): TROPONINIHS in the last 720 hours.   Chemistry Recent Labs  Lab 11/15/19 0749  NA 137  K 4.0  CL 104  CO2 25  GLUCOSE 169*  BUN 14  CREATININE 0.91  CALCIUM 8.6*  GFRNONAA >60  GFRAA >60  ANIONGAP 8    No results for input(s): PROT, ALBUMIN, AST, ALT, ALKPHOS, BILITOT in the last 168 hours. HematologyNo  results for input(s): WBC, RBC, HGB, HCT, MCV, MCH, MCHC, RDW, PLT in the last 168 hours. BNP Recent Labs  Lab 11/15/19 0749  BNP 383.3*    DDimer  Recent Labs  Lab 11/15/19 0749  DDIMER 1.45*     Radiology/Studies:  DG Chest 2 View  Result Date: 11/15/2019 CLINICAL DATA:  Recent pneumonia.  Shortness of breath. EXAM: CHEST - 2 VIEW COMPARISON:  11/03/2019 FINDINGS: Grossly unchanged enlarged cardiac silhouette and mediastinal contours. Progressive rather diffuse though perihilar predominant interstitial thickening. Minimal bibasilar heterogeneous opacities favored to represent atelectasis. No definite pleural effusion or pneumothorax. No acute osseous abnormalities. IMPRESSION: Progressive bilateral perihilar interstitial opacities, potentially pulmonary edema though atypical infection (including COVID-19) could have a similar appearance. Electronically Signed   By: Simonne Come M.D.   On: 11/15/2019 07:44   CT Angio Chest PE W and/or Wo Contrast  Result Date: 11/15/2019 CLINICAL DATA:  Onset shortness of breath yesterday. Oxygen desaturation. The patient reports she was discharged from the hospital 11/08/2019 after admission for pneumonia. EXAM: CT ANGIOGRAPHY CHEST WITH CONTRAST TECHNIQUE: Multidetector CT imaging of the chest was performed using the standard protocol during bolus administration of intravenous contrast. Multiplanar CT image reconstructions and MIPs were obtained to evaluate the vascular anatomy. CONTRAST:  100 mL OMNIPAQUE IOHEXOL 350 MG/ML SOLN COMPARISON:  Single-view of the chest 11/03/2019. PA and lateral chest today. FINDINGS: Cardiovascular: No pulmonary embolus is identified. There is marked cardiomegaly. Minimal aortic atherosclerosis is seen. No aortic aneurysm or pericardial effusion. Mediastinum/Nodes: No enlarged mediastinal, hilar, or axillary lymph nodes. Thyroid gland, trachea, and esophagus demonstrate no significant findings. Lungs/Pleura: Small to moderate  bilateral pleural effusions are present, greater on the right. There is scattered interlobular septal thickening and ground-glass attenuation. Mild basilar atelectasis noted. Upper Abdomen: Status post cholecystectomy. No acute or focal abnormality. Musculoskeletal: No lytic or sclerotic lesion. No acute bony abnormality. Review of the MIP images confirms the above  findings. IMPRESSION: Negative for pulmonary embolus. Findings consistent with pulmonary edema with associated small to moderate pleural effusions, larger on the right. Marked cardiomegaly also noted. Mild aortic atherosclerosis (ICD10-I70.0). Electronically Signed   By: Drusilla Kanner M.D.   On: 11/15/2019 11:30   ECHOCARDIOGRAM COMPLETE  Result Date: 11/15/2019   ECHOCARDIOGRAM REPORT   Patient Name:   Lisa Sellers [facility] Date of Exam: 11/15/2019 Medical Rec #:  [id number]               Height:       67.0 in Accession #:    [id number]              Weight:       260.0 lb Date of Birth:  Feb 24, 1971               BSA:          2.26 m Patient Age:    48 years                BP:           178/131 mmHg Patient Gender: F                       HR:           98 bpm. Exam Location:  Inpatient Procedure: 2D Echo, 3D Echo, Color Doppler, Cardiac Doppler and Strain Analysis Indications:    R06.9 DOE  History:        Patient has prior history of Echocardiogram examinations, most                 recent 04/21/2018. CHF; Risk Factors:Hypertension. Prior echo                 performed at [facility].  Sonographer:    Irving Burton Senior RDCS Referring Phys: [phone] AMRIT ADHIKARI IMPRESSIONS  1. Left ventricular ejection fraction, by visual estimation, is <20%. The left ventricle has severely decreased function. There is no left ventricular hypertrophy.  2. Elevated left atrial pressure.  3. Left ventricular diastolic parameters are consistent with Grade II diastolic dysfunction (pseudonormalization).  4. Moderately dilated left ventricular internal cavity size.  5. The left  ventricle demonstrates global hypokinesis.  6. Global right ventricle has mildly reduced systolic function.The right ventricular size is normal. No increase in right ventricular wall thickness.  7. Left atrial size was moderately dilated.  8. The pericardial effusion is circumferential.  9. Trivial pericardial effusion is present. 10. The mitral valve is normal in structure. Mild mitral valve regurgitation. 11. The tricuspid valve is normal in structure. Tricuspid valve regurgitation is trivial. 12. The aortic valve is normal in structure. Aortic valve regurgitation is not visualized. 13. The pulmonic valve was normal in structure. Pulmonic valve regurgitation is not visualized. 14. TR signal is inadequate for assessing pulmonary artery systolic pressure. 15. The inferior vena cava is dilated in size with <50% respiratory variability, suggesting right atrial pressure of 15 mmHg. 16. The average left ventricular global longitudinal strain is -6.6 %. 17. Right atrial size was normal. FINDINGS  Left Ventricle: Left ventricular ejection fraction, by visual estimation, is <20%. The left ventricle has severely decreased function. The average left ventricular global longitudinal strain is -6.6 %. The left ventricle demonstrates global hypokinesis.  The left ventricular internal cavity size was moderately dilated left ventricle. There is no left ventricular hypertrophy. Left ventricular diastolic parameters are consistent with Grade II diastolic dysfunction (pseudonormalization). Elevated left atrial  pressure. Right Ventricle: The right ventricular size is normal. No increase in right ventricular wall thickness. Global RV systolic function is has mildly reduced systolic function. Left Atrium: Left atrial size was moderately dilated. Right Atrium: Right atrial size was normal in size Pericardium: Trivial pericardial effusion is present. The pericardial effusion is circumferential. Mitral Valve: The mitral valve is normal in  structure. Mild mitral valve regurgitation, with centrally-directed jet. Tricuspid Valve: The tricuspid valve is normal in structure. Tricuspid valve regurgitation is trivial. Aortic Valve: The aortic valve is normal in structure. Aortic valve regurgitation is not visualized. Pulmonic Valve: The pulmonic valve was normal in structure. Pulmonic valve regurgitation is not visualized. Pulmonic regurgitation is not visualized. Aorta: The aortic root and ascending aorta are structurally normal, with no evidence of dilitation. Venous: The inferior vena cava is dilated in size with less than 50% respiratory variability, suggesting right atrial pressure of 15 mmHg. IAS/Shunts: No atrial level shunt detected by color flow Doppler.  LEFT VENTRICLE PLAX 2D LVIDd:         6.20 cm       Diastology LVIDs:         5.90 cm       LV e' lateral:   5.66 cm/s LV PW:         1.30 cm       LV E/e' lateral: 17.0 LV IVS:        1.00 cm       LV e' medial:    6.85 cm/s LVOT diam:     2.00 cm       LV E/e' medial:  14.1 LV SV:         21 ml LV SV Index:   8.59          2D Longitudinal Strain LVOT Area:     3.14 cm      2D Strain GLS Avg:     -6.6 %  LV Volumes (MOD) LV area d, A2C:    46.30 cm LV area d, A4C:    43.00 cm LV area s, A2C:    42.50 cm LV area s, A4C:    36.00 cm LV major d, A2C:   9.54 cm LV major d, A4C:   9.83 cm LV major s, A2C:   9.38 cm LV major s, A4C:   9.22 cm LV vol d, MOD A2C: 190.0 ml LV vol d, MOD A4C: 160.0 ml LV vol s, MOD A2C: 162.0 ml LV vol s, MOD A4C: 126.0 ml LV SV MOD A2C:     28.0 ml LV SV MOD A4C:     160.0 ml LV SV MOD BP:      33.8 ml RIGHT VENTRICLE RV S prime:     8.92 cm/s TAPSE (M-mode): 2.1 cm LEFT ATRIUM             Index       RIGHT ATRIUM           Index LA diam:        4.10 cm 1.81 cm/m  RA Area:     15.40 cm LA Vol (A2C):   86.1 ml 38.08 ml/m RA Volume:   35.10 ml  15.53 ml/m LA Vol (A4C):   81.6 ml 36.09 ml/m LA Biplane Vol: 85.6 ml 37.86 ml/m  AORTIC VALVE LVOT Vmax:   47.00 cm/s  LVOT Vmean:  32.500 cm/s LVOT VTI:    0.066 m  AORTA Ao Root diam: 2.70 cm Ao Asc  diam:  3.10 cm MITRAL VALVE MV Area (PHT): 4.21 cm             SHUNTS MV PHT:        52.20 msec           Systemic VTI:  0.07 m MV Decel Time: 180 msec             Systemic Diam: 2.00 cm MV E velocity: 96.40 cm/s 103 cm/s MV A velocity: 28.70 cm/s 70.3 cm/s MV E/A ratio:  3.36       1.5  Mihai Croitoru MD Electronically signed by Sanda Klein MD Signature Date/Time: 11/15/2019/2:13:57 PM    Final    VAS [place] LOWER EXTREMITY VENOUS (DVT)  Result Date: 11/15/2019  Lower Venous Study Indications: Swelling.  Risk Factors: None identified. Limitations: Body habitus and poor ultrasound/tissue interface. Comparison Study: No prior studies. Performing Technologist: Oliver Hum RVT  Examination Guidelines: A complete evaluation includes B-mode imaging, spectral Doppler, color Doppler, and power Doppler as needed of all accessible portions of each vessel. Bilateral testing is considered an integral part of a complete examination. Limited examinations for reoccurring indications may be performed as noted.  +---------+---------------+---------+-----------+----------+--------------+ RIGHT    CompressibilityPhasicitySpontaneityPropertiesThrombus Aging +---------+---------------+---------+-----------+----------+--------------+ CFV      Full           Yes      Yes                                 +---------+---------------+---------+-----------+----------+--------------+ SFJ      Full                                                        +---------+---------------+---------+-----------+----------+--------------+ FV Prox  Full                                                        +---------+---------------+---------+-----------+----------+--------------+ FV Mid   Full                                                        +---------+---------------+---------+-----------+----------+--------------+ FV  DistalFull                                                        +---------+---------------+---------+-----------+----------+--------------+ PFV      Full                                                        +---------+---------------+---------+-----------+----------+--------------+ POP      Full           Yes      Yes                                 +---------+---------------+---------+-----------+----------+--------------+  PTV      Full                                                        +---------+---------------+---------+-----------+----------+--------------+ PERO     Full                                                        +---------+---------------+---------+-----------+----------+--------------+   +---------+---------------+---------+-----------+----------+--------------+ LEFT     CompressibilityPhasicitySpontaneityPropertiesThrombus Aging +---------+---------------+---------+-----------+----------+--------------+ CFV      Full           Yes      Yes                                 +---------+---------------+---------+-----------+----------+--------------+ SFJ      Full                                                        +---------+---------------+---------+-----------+----------+--------------+ FV Prox  Full                                                        +---------+---------------+---------+-----------+----------+--------------+ FV Mid   Full                                                        +---------+---------------+---------+-----------+----------+--------------+ FV DistalFull                                                        +---------+---------------+---------+-----------+----------+--------------+ PFV      Full                                                        +---------+---------------+---------+-----------+----------+--------------+ POP      Full           Yes      Yes                                  +---------+---------------+---------+-----------+----------+--------------+ PTV      Full                                                        +---------+---------------+---------+-----------+----------+--------------+  PERO     Full                                                        +---------+---------------+---------+-----------+----------+--------------+     Summary: Right: There is no evidence of deep vein thrombosis in the lower extremity. No cystic structure found in the popliteal fossa. Left: There is no evidence of deep vein thrombosis in the lower extremity. No cystic structure found in the popliteal fossa.  *See table(s) above for measurements and observations.    Preliminary          Assessment and Plan:   1. Acute systolic CHF. Patient presents with one month history of dyspnea and cough and now edema. EF 15% by Echo with severe global HK. BNP 383. CT c/w pulmonary edema and small pleural effusions.  Most likely this is related to untreated HTN although could be viral mediated or familial. Recommend IV diuresis with lasix 40 mg bid. Strict I/O, daily weight. Sodium restriction. Will add Entresto and spironolactone. Once volume status is improved would add Coreg. Titrate meds as tolerated. Recommend right and left heart cath on Monday if stable.  2. HTN uncontrolled. Will monitor response to above medication.        For questions or updates, please contact [facility] Please consult [email or web address] for contact info under     Signed, Ruey Storer [place], MD  11/15/2019 3:20 PM

## 2019-11-15 NOTE — ED Notes (Signed)
RN attempted to call report at this time. Sophia, RN unavailable and will call back.

## 2019-11-15 NOTE — Progress Notes (Signed)
11/15/2019  1847  Notified Dr Renford Dills that patient had received Lisinopril at 1530 today and the Entresto comments states not to give within 36 hours of taking an Ace inhibitor. MD called back and stated it is okay to get [facility] although she had the Lisinopril at 1530.

## 2019-11-15 NOTE — ED Notes (Signed)
Patient in CT at this time.

## 2019-11-15 NOTE — ED Triage Notes (Signed)
Pt reports SOB starting yesterday. She was in the hospital for pneumonia until 11/08/19 and states that she was improving until yesterday. She reports that she completed all of her medication. Negative for COVID at her admission on 11/03/19. She denies any known sick  Contacts. Ambulatory, but noticeably winded.

## 2019-11-15 NOTE — ED Notes (Signed)
Spoke to [place], PA-C about patient being unable to ambulate safely due to SOB and decreased O2 sat. Will continue to monitor.

## 2019-11-16 LAB — HIV ANTIBODY (ROUTINE TESTING W REFLEX): HIV Screen 4th Generation wRfx: NONREACTIVE

## 2019-11-16 LAB — CBC
HCT: 31.5 % — ABNORMAL LOW (ref 36.0–46.0)
Hemoglobin: 9.2 g/dL — ABNORMAL LOW (ref 12.0–15.0)
MCH: 23.5 pg — ABNORMAL LOW (ref 26.0–34.0)
MCHC: 29.2 g/dL — ABNORMAL LOW (ref 30.0–36.0)
MCV: 80.6 fL (ref 80.0–100.0)
Platelets: 320 10*3/uL (ref 150–400)
RBC: 3.91 MIL/uL (ref 3.87–5.11)
RDW: 19.3 % — ABNORMAL HIGH (ref 11.5–15.5)
WBC: 7.9 10*3/uL (ref 4.0–10.5)
nRBC: 0.5 % — ABNORMAL HIGH (ref 0.0–0.2)

## 2019-11-16 LAB — BASIC METABOLIC PANEL
Anion gap: 9 (ref 5–15)
BUN: 14 mg/dL (ref 6–20)
CO2: 29 mmol/L (ref 22–32)
Calcium: 8.7 mg/dL — ABNORMAL LOW (ref 8.9–10.3)
Chloride: 103 mmol/L (ref 98–111)
Creatinine, Ser: 1.01 mg/dL — ABNORMAL HIGH (ref 0.44–1.00)
GFR calc Af Amer: >60 mL/min (ref >60)
GFR calc non Af Amer: >60 mL/min (ref >60)
Glucose, Bld: 128 mg/dL — ABNORMAL HIGH (ref 70–99)
Potassium: 3.7 mmol/L (ref 3.5–5.1)
Sodium: 141 mmol/L (ref 135–145)

## 2019-11-16 MED ORDER — SODIUM CHLORIDE 0.9 % IV SOLN: INTRAVENOUS | Status: DC

## 2019-11-16 MED ORDER — SODIUM CHLORIDE 0.9% FLUSH
3.0000 mL | Freq: Two times a day (BID) | INTRAVENOUS | Status: DC
  Administered 2019-11-16 – 2019-11-19 (×5): 3 mL via INTRAVENOUS

## 2019-11-16 MED ORDER — SODIUM CHLORIDE 0.9 % IV SOLN: 250.0000 mL | INTRAVENOUS | Status: DC | PRN

## 2019-11-16 NOTE — Progress Notes (Signed)
Hospitalist progress note   Patient from home, Patient going -likely home, Dispo awaiting Cardiac cath and Cardiology to s/o  Lisa Sellers [id number] DOB: 02-Jun-1971 DOA: 11/15/2019  PCP: Catha Gosselin, MD   Narrative:  49 year old black female anxiety BMI 39 HTN seen 11/03/2019 urgent care with shortness of breath cough came to ED-?  Pneumonia Rx Rocephin doxycycline-return to ED 1/29 increasing cough S OB orthopnea increased LE swelling Covid test was negative Patient was found to have possible new onset heart failure cardiology consulted  Data Reviewed:  Echocardiogram EF 15% severe global hypokinesis CT chest pulmonary edema small to moderate pleural effusion right side greater marked cardiomegaly BUN/creatinine 14/1.0 sodium 141 Iron 27 saturation ratios 6 ferritin 11 Hemoglobin 9.2 down from 9.8 platelet 320 WBC 7.9 Ultrasound lower extremity 1/29 - for DVT  Assessment & Plan:  New onset systolic heart failure acute EF 15% Appreciate cardiology input 60 every 12 Entresto twice daily and Aldactone 12.5 not a candidate for beta-blocker given acuteness of situation No recent concerns for psychological stress leading to Takotsubo-tells me son who is 63 diagnosed with heart failure 7 mo ago Looks like Card Cath 11/18/19 Recent multifocal pneumonia Zithromax and ceftriaxone-monitor trends--rpt CBc am CKD stage I Monitor trends diuretics etc. Likely iron deficiency anemia Borderline microcytic with iron studies indicated need supplementation on discharge BMI 39   Subjective: Awake pleasant in nad no focal deficit sittign up no sob, platypneao cp f chills  Consultants:   Cardiology  Objective: Vitals:   11/16/19 0300 11/16/19 0356 11/16/19 0357 11/16/19 0500  BP: 102/87   114/67  Pulse: 95   85  Resp: (!) 25   (!) 23  Temp: 97.9 F (36.6 C) 98.3 F (36.8 C)  98.1 F (36.7 C)  TempSrc:      SpO2: 95%   97%  Weight:   115.1 kg   Height:       No intake or  output data in the 24 hours ending 11/16/19 0746 Filed Weights   11/15/19 8416 11/16/19 0357  Weight: 117.9 kg 115.1 kg    Examination: eomi pleasant no brui no jvd @ 30 deg cta b  no added soudn Tele benign but some pvc and tachy  Non sust Neuro intact no focal deficit Trace LE edema Neuro intact  Scheduled Meds: . aspirin  81 mg Oral Pre-Cath  . Chlorhexidine Gluconate Cloth  6 each Topical Q0600  . enoxaparin (LOVENOX) injection  60 mg Subcutaneous Q24H  . furosemide  60 mg Intravenous Q12H  . gabapentin  100 mg Oral BID  . lamoTRIgine  400 mg Oral QHS  . mouth rinse  15 mL Mouth Rinse BID  . [START ON 11/17/2019] sacubitril-valsartan  1 tablet Oral BID  . sodium chloride flush  3 mL Intravenous Q12H  . spironolactone  12.5 mg Oral Daily   Continuous Infusions: . sodium chloride    . sodium chloride Stopped (11/16/19 6063)     LOS: 1 day   Time spent: 12 Pleas Koch, MD [facility]  11/16/2019, 7:46 AM

## 2019-11-16 NOTE — Progress Notes (Signed)
Progress Note   Subjective   Doing well today, the patient denies CP or SOB. She feels "much better" and slept well overnight.   No new concerns  Inpatient Medications    Scheduled Meds: . aspirin  81 mg Oral Pre-Cath  . Chlorhexidine Gluconate Cloth  6 each Topical Q0600  . enoxaparin (LOVENOX) injection  60 mg Subcutaneous Q24H  . furosemide  60 mg Intravenous Q12H  . gabapentin  100 mg Oral BID  . lamoTRIgine  400 mg Oral QHS  . mouth rinse  15 mL Mouth Rinse BID  . [START ON 11/17/2019] sacubitril-valsartan  1 tablet Oral BID  . sodium chloride flush  3 mL Intravenous Q12H  . spironolactone  12.5 mg Oral Daily   Continuous Infusions: . [START ON 11/18/2019] sodium chloride    . [START ON 11/18/2019] sodium chloride     PRN Meds: [START ON 11/18/2019] sodium chloride, LORazepam, sodium chloride flush   Vital Signs    Vitals:   11/16/19 0356 11/16/19 0357 11/16/19 0500 11/16/19 0853  BP:   114/67   Pulse:   85   Resp:   (!) 23   Temp: 98.3 F (36.8 C)  98.1 F (36.7 C) 99.1 F (37.3 C)  TempSrc:    Oral  SpO2:   97%   Weight:  115.1 kg    Height:       No intake or output data in the 24 hours ending 11/16/19 0913 Filed Weights   11/15/19 5277 11/16/19 0357  Weight: 117.9 kg 115.1 kg    Telemetry    sinus - Personally Reviewed  Physical Exam   GEN- The patient is well appearing, alert and oriented x 3 today.   Head- normocephalic, atraumatic Eyes-  Sclera clear, conjunctiva pink Ears- hearing intact Oropharynx- clear Neck- supple, Lungs-  normal work of breathing Heart- Regular rate and rhythm  GI- soft, NT, ND, + BS Extremities- + edema MS- no significant deformity or atrophy Skin- no rash or lesion Psych- euthymic mood, full affect Neuro- strength and sensation are intact   Labs    Chemistry Recent Labs  Lab 11/15/19 0749 11/16/19 0118  NA 137 141  K 4.0 3.7  CL 104 103  CO2 25 29  GLUCOSE 169* 128*  BUN 14 14  CREATININE 0.91 1.01*   CALCIUM 8.6* 8.7*  GFRNONAA >60 >60  GFRAA >60 >60  ANIONGAP 8 9     Hematology Recent Labs  Lab 11/15/19 1925 11/16/19 0118  WBC 8.2 7.9  RBC 4.21 3.91  HGB 9.8* 9.2*  HCT 34.1* 31.5*  MCV 81.0 80.6  MCH 23.3* 23.5*  MCHC 28.7* 29.2*  RDW 19.4* 19.3*  PLT 372 320     Patient Profile:   Lisa Sellers is a 49 y.o. female with a hx of HTN who is being seen today for the evaluation of CHF.    Assessment & Plan    1.  Acute systolic dysfunction New diagnosis EF 15% Improving with IV diuresis Doing well with entresto and spironolactone Add coreg as bp allows Strict Is and Os I agree with Dr Swaziland that cath is indicated on Monday. I would therefore recommend right and left heart catheterization with possible PCI.  Discussed the cath with the patient. The patient understands that risks included but are not limited to stroke (1 in 1000), death (1 in 1000), kidney failure [usually temporary] (1 in 500), bleeding (1 in 200), allergic reaction [possibly serious] (1 in 200).  The patient understands and agrees to proceed.   2. HTN Improved with recent medicine changes  Ok to transfer to telemetry  Thompson Grayer MD, [facility] 11/16/2019 9:13 AM

## 2019-11-17 ENCOUNTER — Inpatient Hospital Stay (HOSPITAL_COMMUNITY): Payer: BC Managed Care – PPO

## 2019-11-17 DIAGNOSIS — I5023 Acute on chronic systolic (congestive) heart failure: Secondary | ICD-10-CM

## 2019-11-17 LAB — CBC WITH DIFFERENTIAL/PLATELET
Abs Immature Granulocytes: 0.03 10*3/uL (ref 0.00–0.07)
Basophils Absolute: 0 10*3/uL (ref 0.0–0.1)
Basophils Relative: 0 %
Eosinophils Absolute: 0.1 10*3/uL (ref 0.0–0.5)
Eosinophils Relative: 2 %
HCT: 31.9 % — ABNORMAL LOW (ref 36.0–46.0)
Hemoglobin: 9.2 g/dL — ABNORMAL LOW (ref 12.0–15.0)
Immature Granulocytes: 0 %
Lymphocytes Relative: 37 %
Lymphs Abs: 2.7 10*3/uL (ref 0.7–4.0)
MCH: 23.5 pg — ABNORMAL LOW (ref 26.0–34.0)
MCHC: 28.8 g/dL — ABNORMAL LOW (ref 30.0–36.0)
MCV: 81.4 fL (ref 80.0–100.0)
Monocytes Absolute: 0.7 10*3/uL (ref 0.1–1.0)
Monocytes Relative: 10 %
Neutro Abs: 3.6 10*3/uL (ref 1.7–7.7)
Neutrophils Relative %: 51 %
Platelets: 346 10*3/uL (ref 150–400)
RBC: 3.92 MIL/uL (ref 3.87–5.11)
RDW: 19 % — ABNORMAL HIGH (ref 11.5–15.5)
WBC: 7.2 10*3/uL (ref 4.0–10.5)
nRBC: 0 % (ref 0.0–0.2)

## 2019-11-17 LAB — RENAL FUNCTION PANEL
Albumin: 3.3 g/dL — ABNORMAL LOW (ref 3.5–5.0)
Anion gap: 7 (ref 5–15)
BUN: 19 mg/dL (ref 6–20)
CO2: 29 mmol/L (ref 22–32)
Calcium: 8.6 mg/dL — ABNORMAL LOW (ref 8.9–10.3)
Chloride: 102 mmol/L (ref 98–111)
Creatinine, Ser: 1.18 mg/dL — ABNORMAL HIGH (ref 0.44–1.00)
GFR calc Af Amer: >60 mL/min (ref >60)
GFR calc non Af Amer: 54 mL/min — ABNORMAL LOW (ref >60)
Glucose, Bld: 167 mg/dL — ABNORMAL HIGH (ref 70–99)
Phosphorus: 3.9 mg/dL (ref 2.5–4.6)
Potassium: 3.6 mmol/L (ref 3.5–5.1)
Sodium: 138 mmol/L (ref 135–145)

## 2019-11-17 MED ORDER — ASPIRIN 81 MG PO CHEW
81.0000 mg | CHEWABLE_TABLET | ORAL | Status: AC
  Administered 2019-11-18: 81 mg via ORAL
  Filled 2019-11-17: qty 1

## 2019-11-17 NOTE — Progress Notes (Signed)
Progress Note   Subjective   Doing well today, the patient denies CP or SOB.  No new concerns  Inpatient Medications    Scheduled Meds: . Chlorhexidine Gluconate Cloth  6 each Topical Q0600  . enoxaparin (LOVENOX) injection  60 mg Subcutaneous Q24H  . furosemide  60 mg Intravenous Q12H  . gabapentin  100 mg Oral BID  . lamoTRIgine  400 mg Oral QHS  . mouth rinse  15 mL Mouth Rinse BID  . sacubitril-valsartan  1 tablet Oral BID  . sodium chloride flush  3 mL Intravenous Q12H  . sodium chloride flush  3 mL Intravenous Q12H  . spironolactone  12.5 mg Oral Daily   Continuous Infusions: . [START ON 11/18/2019] sodium chloride    . [START ON 11/18/2019] sodium chloride     PRN Meds: [START ON 11/18/2019] sodium chloride, LORazepam, sodium chloride flush   Vital Signs    Vitals:   11/16/19 2015 11/17/19 0015 11/17/19 0400 11/17/19 0530  BP: (!) 155/80 (!) 168/90 131/80   Pulse: 92 91 89   Resp: (!) 25 (!) 35    Temp: 98.1 F (36.7 C) 98.3 F (36.8 C) 98.1 F (36.7 C)   TempSrc: Oral Oral Oral   SpO2: 98% 94% 97%   Weight:    113.1 kg  Height:        Intake/Output Summary (Last 24 hours) at 11/17/2019 8546 Last data filed at 11/17/2019 0500 Gross per 24 hour  Intake --  Output 1950 ml  Net -1950 ml   Filed Weights   11/15/19 2703 11/16/19 0357 11/17/19 0530  Weight: 117.9 kg 115.1 kg 113.1 kg    Telemetry    sinus - Personally Reviewed  Physical Exam   GEN- The patient is well appearing, alert and oriented x 3 today.   Head- normocephalic, atraumatic Eyes-  Sclera clear, conjunctiva pink Ears- hearing intact Oropharynx- clear Neck- supple, Lungs-   normal work of breathing Heart- Regular rate and rhythm  GI- soft  + edema  MS- no significant deformity or atrophy Skin- no rash or lesion Psych- euthymic mood, full affect Neuro- strength and sensation are intact   Labs    Chemistry Recent Labs  Lab 11/15/19 0749 11/16/19 0118 11/17/19 0241  NA  137 141 138  K 4.0 3.7 3.6  CL 104 103 102  CO2 25 29 29   GLUCOSE 169* 128* 167*  BUN 14 14 19   CREATININE 0.91 1.01* 1.18*  CALCIUM 8.6* 8.7* 8.6*  ALBUMIN  --   --  3.3*  GFRNONAA >60 >60 54*  GFRAA >60 >60 >60  ANIONGAP 8 9 7      Hematology Recent Labs  Lab 11/15/19 1925 11/16/19 0118 11/17/19 0241  WBC 8.2 7.9 7.2  RBC 4.21 3.91 3.92  HGB 9.8* 9.2* 9.2*  HCT 34.1* 31.5* 31.9*  MCV 81.0 80.6 81.4  MCH 23.3* 23.5* 23.5*  MCHC 28.7* 29.2* 28.8*  RDW 19.4* 19.3* 19.0*  PLT 372 320 346     Patient Profile:   Lisa Lehrmann Gilmoreis a 49 y.o.femalewith a hx of HTNwho is being seen today for the evaluation of CHF.        Assessment & Plan    1.  Acute systolic dysfunction EF 15% New diagnosis Improving clinically with IV diuresis Planned for RHC/LHC with possible PCI tomorrow. Orders placed Risks of procedure discussed with her at length  2. HTN improving No change required today   11/19/19 MD, [facility] 11/17/2019 8:33  AM

## 2019-11-17 NOTE — Progress Notes (Signed)
Hospitalist progress note   Patient from home, Patient going -likely home, Dispo awaiting Cardiac cath and Cardiology to s/o  Lisa Sellers [id number] DOB: 1970-10-22 DOA: 11/15/2019  PCP: Catha Gosselin, MD   Narrative:  49 year old black female anxiety BMI 39 HTN seen 11/03/2019 urgent care with shortness of breath cough came to ED-?  Pneumonia Rx Rocephin doxycycline-return to ED 1/29 increasing cough S OB orthopnea increased LE swelling Covid test was negative Patient was found to have possible new onset heart failure cardiology consulted  Data Reviewed:  Echocardiogram EF 15% severe global hypokinesis CT chest pulmonary edema small to moderate pleural effusion right side greater marked cardiomegaly BUN/creatinine 14/1.0-->40/1.2 sodium 141-->133, potassium 3.7 Ultrasound lower extremity 1/29 - for DVT  Assessment & Plan:  New onset systolic heart failure acute EF 15% Appreciate cardiology input 60 every 12 Entresto twice daily and Aldactone 12.5 not a candidate for beta-blocker given acuteness of situation No suspicion Takotsubo-tells me son who is 72 diagnosed with heart failure 7 mo ago Looks like Card Cath 11/18/19 Overall stable -1.95 L Recent multifocal pneumonia Zithromax and ceftriaxone-monitor trends--no elevation of WBC-transitioned off procedure to oral antibiotics CKD stage I Monitor trends diuretics etc. Likely iron deficiency anemia Borderline microcytic with iron studies indicated need supplementation on discharge-holding iron  Anxiety Appears to be on Lamictal and gabapentin which is an odd indication this may need to be reconsidered although she states it works well for her BMI 39   Subjective: Awake coherent breathing well Questions about procedure and FMLA No spontaneous other complaints   Consultants:   Cardiology  Objective: Vitals:   11/16/19 2015 11/17/19 0015 11/17/19 0400 11/17/19 0530  BP: (!) 155/80 (!) 168/90 131/80   Pulse: 92 91  89   Resp: (!) 25 (!) 35    Temp: 98.1 F (36.7 C) 98.3 F (36.8 C) 98.1 F (36.7 C)   TempSrc: Oral Oral Oral   SpO2: 98% 94% 97%   Weight:    113.1 kg  Height:        Intake/Output Summary (Last 24 hours) at 11/17/2019 0814 Last data filed at 11/17/2019 0500 Gross per 24 hour  Intake --  Output 1950 ml  Net -1950 ml   Filed Weights   11/15/19 0633 11/16/19 0357 11/17/19 0530  Weight: 117.9 kg 115.1 kg 113.1 kg    Examination: Awake coherent no distress EOMI NCAT Chest clear Abdomen soft no rebound Trace lower extremity edema left side greater than side right side Neurologically intact smile symmetric moving all 4 limbs power 5/5  Scheduled Meds: . Chlorhexidine Gluconate Cloth  6 each Topical Q0600  . enoxaparin (LOVENOX) injection  60 mg Subcutaneous Q24H  . furosemide  60 mg Intravenous Q12H  . gabapentin  100 mg Oral BID  . lamoTRIgine  400 mg Oral QHS  . mouth rinse  15 mL Mouth Rinse BID  . sacubitril-valsartan  1 tablet Oral BID  . sodium chloride flush  3 mL Intravenous Q12H  . sodium chloride flush  3 mL Intravenous Q12H  . spironolactone  12.5 mg Oral Daily   Continuous Infusions: . [START ON 11/18/2019] sodium chloride    . [START ON 11/18/2019] sodium chloride       LOS: 2 days   Time spent: 47 Pleas Koch, MD [facility]  11/17/2019, 8:14 AM

## 2019-11-18 ENCOUNTER — Encounter (HOSPITAL_COMMUNITY)
Admission: EM | Disposition: A | Discharge status: Home / Self Care | Payer: Self-pay | Source: Home / Self Care | Attending: Family Medicine

## 2019-11-18 DIAGNOSIS — I5041 Acute combined systolic (congestive) and diastolic (congestive) heart failure: Secondary | ICD-10-CM

## 2019-11-18 DIAGNOSIS — J810 Acute pulmonary edema: Secondary | ICD-10-CM

## 2019-11-18 HISTORY — PX: RIGHT/LEFT HEART CATH AND CORONARY ANGIOGRAPHY: CATH118266

## 2019-11-18 LAB — POCT I-STAT 7, (LYTES, BLD GAS, ICA,H+H)
Acid-Base Excess: 2 mmol/L (ref 0.0–2.0)
Bicarbonate: 26.5 mmol/L (ref 20.0–28.0)
Calcium, Ion: 1.09 mmol/L — ABNORMAL LOW (ref 1.15–1.40)
HCT: 32 % — ABNORMAL LOW (ref 36.0–46.0)
Hemoglobin: 10.9 g/dL — ABNORMAL LOW (ref 12.0–15.0)
O2 Saturation: 95 %
Potassium: 3.4 mmol/L — ABNORMAL LOW (ref 3.5–5.1)
Sodium: 142 mmol/L (ref 135–145)
TCO2: 28 mmol/L (ref 22–32)
pCO2 arterial: 40 mmHg (ref 32.0–48.0)
pH, Arterial: 7.429 (ref 7.350–7.450)
pO2, Arterial: 74 mmHg — ABNORMAL LOW (ref 83.0–108.0)

## 2019-11-18 LAB — PREGNANCY, URINE: Preg Test, Ur: NEGATIVE

## 2019-11-18 LAB — POCT I-STAT EG7
Acid-Base Excess: 1 mmol/L (ref 0.0–2.0)
Acid-Base Excess: 4 mmol/L — ABNORMAL HIGH (ref 0.0–2.0)
Bicarbonate: 26 mmol/L (ref 20.0–28.0)
Bicarbonate: 28.9 mmol/L — ABNORMAL HIGH (ref 20.0–28.0)
Calcium, Ion: 0.97 mmol/L — ABNORMAL LOW (ref 1.15–1.40)
Calcium, Ion: 1.13 mmol/L — ABNORMAL LOW (ref 1.15–1.40)
HCT: 29 % — ABNORMAL LOW (ref 36.0–46.0)
HCT: 32 % — ABNORMAL LOW (ref 36.0–46.0)
Hemoglobin: 10.9 g/dL — ABNORMAL LOW (ref 12.0–15.0)
Hemoglobin: 9.9 g/dL — ABNORMAL LOW (ref 12.0–15.0)
O2 Saturation: 64 %
O2 Saturation: 64 %
Potassium: 3 mmol/L — ABNORMAL LOW (ref 3.5–5.1)
Potassium: 3.5 mmol/L (ref 3.5–5.1)
Sodium: 143 mmol/L (ref 135–145)
Sodium: 145 mmol/L (ref 135–145)
TCO2: 27 mmol/L (ref 22–32)
TCO2: 30 mmol/L (ref 22–32)
pCO2, Ven: 41.5 mmHg — ABNORMAL LOW (ref 44.0–60.0)
pCO2, Ven: 43 mmHg — ABNORMAL LOW (ref 44.0–60.0)
pH, Ven: 7.404 (ref 7.250–7.430)
pH, Ven: 7.435 — ABNORMAL HIGH (ref 7.250–7.430)
pO2, Ven: 32 mmHg (ref 32.0–45.0)
pO2, Ven: 33 mmHg (ref 32.0–45.0)

## 2019-11-18 SURGERY — RIGHT/LEFT HEART CATH AND CORONARY ANGIOGRAPHY
Anesthesia: LOCAL

## 2019-11-18 MED ORDER — FENTANYL CITRATE (PF) 100 MCG/2ML IJ SOLN
INTRAMUSCULAR | Status: DC | PRN
  Administered 2019-11-18: 25 ug via INTRAVENOUS

## 2019-11-18 MED ORDER — HEPARIN SODIUM (PORCINE) 1000 UNIT/ML IJ SOLN
INTRAMUSCULAR | Status: DC | PRN
  Administered 2019-11-18: 5000 [IU] via INTRAVENOUS

## 2019-11-18 MED ORDER — VERAPAMIL HCL 2.5 MG/ML IV SOLN
INTRAVENOUS | Status: DC | PRN
  Administered 2019-11-18: 10 mL via INTRA_ARTERIAL

## 2019-11-18 MED ORDER — HEPARIN (PORCINE) IN NACL 1000-0.9 UT/500ML-% IV SOLN
INTRAVENOUS | Status: DC | PRN
  Administered 2019-11-18: 500 mL

## 2019-11-18 MED ORDER — ONDANSETRON HCL 4 MG/2ML IJ SOLN: 4.0000 mg | Freq: Four times a day (QID) | INTRAMUSCULAR | Status: DC | PRN

## 2019-11-18 MED ORDER — LABETALOL HCL 5 MG/ML IV SOLN: 10.0000 mg | INTRAVENOUS | Status: AC | PRN

## 2019-11-18 MED ORDER — MIDAZOLAM HCL 2 MG/2ML IJ SOLN
INTRAMUSCULAR | Status: AC
  Filled 2019-11-18: qty 2

## 2019-11-18 MED ORDER — VERAPAMIL HCL 2.5 MG/ML IV SOLN
INTRAVENOUS | Status: AC
  Filled 2019-11-18: qty 2

## 2019-11-18 MED ORDER — SODIUM CHLORIDE 0.9 % IV SOLN: 250.0000 mL | INTRAVENOUS | Status: DC | PRN

## 2019-11-18 MED ORDER — SODIUM CHLORIDE 0.9% FLUSH: 3.0000 mL | INTRAVENOUS | Status: DC | PRN

## 2019-11-18 MED ORDER — MIDAZOLAM HCL 2 MG/2ML IJ SOLN
INTRAMUSCULAR | Status: DC | PRN
  Administered 2019-11-18: 1 mg via INTRAVENOUS

## 2019-11-18 MED ORDER — HYDRALAZINE HCL 20 MG/ML IJ SOLN: 10.0000 mg | INTRAMUSCULAR | Status: AC | PRN

## 2019-11-18 MED ORDER — LIDOCAINE HCL (PF) 1 % IJ SOLN
INTRAMUSCULAR | Status: AC
  Filled 2019-11-18: qty 30

## 2019-11-18 MED ORDER — HEPARIN (PORCINE) IN NACL 1000-0.9 UT/500ML-% IV SOLN
INTRAVENOUS | Status: AC
  Filled 2019-11-18: qty 1000

## 2019-11-18 MED ORDER — IOHEXOL 350 MG/ML SOLN
INTRAVENOUS | Status: DC | PRN
  Administered 2019-11-18: 14:00:00 50 mL

## 2019-11-18 MED ORDER — LIDOCAINE HCL (PF) 1 % IJ SOLN
INTRAMUSCULAR | Status: DC | PRN
  Administered 2019-11-18 (×2): 2 mL

## 2019-11-18 MED ORDER — FENTANYL CITRATE (PF) 100 MCG/2ML IJ SOLN
INTRAMUSCULAR | Status: AC
  Filled 2019-11-18: qty 2

## 2019-11-18 MED ORDER — SODIUM CHLORIDE 0.9 % IV SOLN: INTRAVENOUS | Status: AC

## 2019-11-18 MED ORDER — SODIUM CHLORIDE 0.9% FLUSH
3.0000 mL | Freq: Two times a day (BID) | INTRAVENOUS | Status: DC
  Administered 2019-11-19: 3 mL via INTRAVENOUS

## 2019-11-18 MED ORDER — ENOXAPARIN SODIUM 40 MG/0.4ML ~~LOC~~ SOLN
40.0000 mg | SUBCUTANEOUS | Status: DC
  Administered 2019-11-19: 40 mg via SUBCUTANEOUS
  Filled 2019-11-18: qty 0.4

## 2019-11-18 MED ORDER — ACETAMINOPHEN 325 MG PO TABS: 650.0000 mg | ORAL_TABLET | ORAL | Status: DC | PRN

## 2019-11-18 MED ORDER — SODIUM CHLORIDE 0.9 % IV SOLN: INTRAVENOUS | Status: DC

## 2019-11-18 MED ORDER — HEPARIN SODIUM (PORCINE) 1000 UNIT/ML IJ SOLN
INTRAMUSCULAR | Status: AC
  Filled 2019-11-18: qty 1

## 2019-11-18 SURGICAL SUPPLY — 11 items
CATH 5FR JL3.5 JR4 ANG PIG MP (CATHETERS) ×1 IMPLANT
CATH BALLN WEDGE 5F 110CM (CATHETERS) ×1 IMPLANT
DEVICE RAD COMP TR BAND LRG (VASCULAR PRODUCTS) ×1 IMPLANT
GLIDESHEATH SLEND SS 6F .021 (SHEATH) ×1 IMPLANT
GUIDEWIRE .025 260CM (WIRE) ×1 IMPLANT
GUIDEWIRE INQWIRE 1.5J.035X260 (WIRE) IMPLANT
INQWIRE 1.5J .035X260CM (WIRE) ×2
PACK CARDIAC CATHETERIZATION (CUSTOM PROCEDURE TRAY) ×2 IMPLANT
SHEATH GLIDE SLENDER 4/5FR (SHEATH) ×1 IMPLANT
SHEATH PROBE COVER 6X72 (BAG) ×1 IMPLANT
TRANSDUCER W/STOPCOCK (MISCELLANEOUS) ×2 IMPLANT

## 2019-11-18 NOTE — Progress Notes (Addendum)
Progress Note  Patient Name: Lexine Jaspers Date of Encounter: 11/18/2019  Primary Cardiologist: New to Dr. Swaziland  Subjective   No complaint.  No chest pain or shortness of breath.  For left and right cath today.  Inpatient Medications    Scheduled Meds: . Chlorhexidine Gluconate Cloth  6 each Topical Q0600  . enoxaparin (LOVENOX) injection  60 mg Subcutaneous Q24H  . furosemide  60 mg Intravenous Q12H  . gabapentin  100 mg Oral BID  . lamoTRIgine  400 mg Oral QHS  . mouth rinse  15 mL Mouth Rinse BID  . sacubitril-valsartan  1 tablet Oral BID  . sodium chloride flush  3 mL Intravenous Q12H  . sodium chloride flush  3 mL Intravenous Q12H  . spironolactone  12.5 mg Oral Daily   Continuous Infusions: . sodium chloride    . sodium chloride     PRN Meds: sodium chloride, LORazepam, sodium chloride flush   Vital Signs    Vitals:   11/17/19 1423 11/17/19 2150 11/18/19 0434 11/18/19 0518  BP: 136/73 115/69 129/80   Pulse: 92 94 98   Resp: (!) 24 20 20    Temp: 97.6 F (36.4 C) 98.2 F (36.8 C) 98.1 F (36.7 C)   TempSrc: Oral Oral Oral   SpO2: 95% 94% 91%   Weight:    112.4 kg  Height:        Intake/Output Summary (Last 24 hours) at 11/18/2019 1019 Last data filed at 11/18/2019 0831 Gross per 24 hour  Intake 460 ml  Output 2150 ml  Net -1690 ml   Last 3 Weights 11/18/2019 11/17/2019 11/16/2019  Weight (lbs) 247 lb 14.4 oz 249 lb 5.4 oz 253 lb 12 oz  Weight (kg) 112.447 kg 113.1 kg 115.1 kg      Telemetry    Sinus rhythm at controlled ventricular rate- Personally Reviewed  ECG  No new tracing  Physical Exam   GEN: No acute distress.   Neck: No JVD Cardiac: RRR, no murmurs, rubs, or gallops.  Respiratory: Clear to auscultation bilaterally. GI: Soft, nontender, non-distended  MS: Trace edema edema; No deformity. Neuro:  Nonfocal  Psych: Normal affect   Labs      Chemistry Recent Labs  Lab 11/15/19 0749 11/16/19 0118 11/17/19 0241  NA  137 141 138  K 4.0 3.7 3.6  CL 104 103 102  CO2 25 29 29   GLUCOSE 169* 128* 167*  BUN 14 14 19   CREATININE 0.91 1.01* 1.18*  CALCIUM 8.6* 8.7* 8.6*  ALBUMIN  --   --  3.3*  GFRNONAA >60 >60 54*  GFRAA >60 >60 >60  ANIONGAP 8 9 7      Hematology Recent Labs  Lab 11/15/19 1925 11/16/19 0118 11/17/19 0241  WBC 8.2 7.9 7.2  RBC 4.21 3.91 3.92  HGB 9.8* 9.2* 9.2*  HCT 34.1* 31.5* 31.9*  MCV 81.0 80.6 81.4  MCH 23.3* 23.5* 23.5*  MCHC 28.7* 29.2* 28.8*  RDW 19.4* 19.3* 19.0*  PLT 372 320 346    BNP Recent Labs  Lab 11/15/19 0749  BNP 383.3*     DDimer  Recent Labs  Lab 11/15/19 0749  DDIMER 1.45*     Radiology    DG Chest 2 View  Result Date: 11/17/2019 CLINICAL DATA:  Evaluate pneumonia. EXAM: CHEST - 2 VIEW COMPARISON:  11/15/2019 FINDINGS: Lungs are hypoinflated with interval improvement in the perihilar vessels likely improving/resolving vascular congestion. No evidence of effusion. Borderline stable cardiomegaly. Remainder of the exam  is unchanged. IMPRESSION: Interval improvement in the perihilar vessels suggesting improving/resolving mild vascular congestion. Electronically Signed   By: Elberta Fortis M.D.   On: 11/17/2019 09:25    Cardiac Studies  Echo 11/15/19 1. Left ventricular ejection fraction, by visual estimation, is <20%. The  left ventricle has severely decreased function. There is no left  ventricular hypertrophy.  2. Elevated left atrial pressure.  3. Left ventricular diastolic parameters are consistent with Grade II  diastolic dysfunction (pseudonormalization).  4. Moderately dilated left ventricular internal cavity size.  5. The left ventricle demonstrates global hypokinesis.  6. Global right ventricle has mildly reduced systolic function.The right  ventricular size is normal. No increase in right ventricular wall  thickness.  7. Left atrial size was moderately dilated.  8. The pericardial effusion is circumferential.  9. Trivial  pericardial effusion is present.  10. The mitral valve is normal in structure. Mild mitral valve  regurgitation.  11. The tricuspid valve is normal in structure. Tricuspid valve  regurgitation is trivial.  12. The aortic valve is normal in structure. Aortic valve regurgitation is  not visualized.  13. The pulmonic valve was normal in structure. Pulmonic valve  regurgitation is not visualized.  14. TR signal is inadequate for assessing pulmonary artery systolic  pressure.  15. The inferior vena cava is dilated in size with <50% respiratory  variability, suggesting right atrial pressure of 15 mmHg.  16. The average left ventricular global longitudinal strain is -6.6 %.  17. Right atrial size was normal.   Patient Profile     49 y.o. female with history of hypertension anxiety and obesity who recently had multifocal pneumonia presented 1/29 with increased shortness of breath and cough who admitted for acute CHF exacerbation.  Covid negative.  Assessment & Plan    1.  Acute combined CHF -BNP 383.  Chest x-ray with vascular congestion.  Echocardiogram showed LV function less than 20% and grade 2 diastolic dysfunction. -Diuresed -3.6 L so far.  Weight 260>>247lb. She appears euvolemic. -Currently on IV Lasix 60 mg twice daily (changed to p.o. based on pressure by cath), Entresto 24/26 mg twice daily and Spironolactone 12.5 mg daily. -For left and right cath later today.  He is NPO.  Add beta-blocker post procedure.  2.  Hypertension -Stable on current medication  For questions or updates, please contact [facility] Please consult [email or web address] for contact info under        Signed, Manson Passey, PA  11/18/2019, 10:19 AM    I have seen and examined the patient along with Manson Passey, PA .  I have reviewed the chart, notes and new data.  I agree with PA/NP's note.  Key new complaints: breathing is greatly improved. Hospitalization was preceded by weeks of abdominal  bloating/discomfort, edema, non-productive cough, dyspnea on exertion and eventually orthopnea. Her 74 year old son has severe cardiomyopathy , discussing ICD. Key examination changes: normal JVP and no edema, no gallop or murmur, RRR. Obese. Key new findings / data: echo reviewed - LVEF<20%.  PLAN: R and L cath today. This procedure has been fully reviewed with the patient and written informed consent has been obtained. Need to exclude CAD, but she does not have angina, has limited coronary risk factors, does not have Q waves on ECG or regional abnormalities on echo. Suspect nonischemic mechanism. 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 Discussed optimal medical therapy w carvedilol and Entresto in gradually titrated doses, loop diuretics and MRA, followed by reevaluation of EF after 3 months  of OMT before we would consider ICD. Reviewed sodium restricted diet, weight monitoring, signs/symptoms of HF exacerbation w patient and spouse.  Sanda Klein, MD, [place] [phone] 11/18/2019, 2:14 PM

## 2019-11-18 NOTE — Progress Notes (Signed)
Hospitalist progress note   Patient from home, Patient going -likely home, Dispo awaiting Cardiac cath and Cardiology to s/o  Lisa Sellers [id number] DOB: [birth year] DOA: 11/15/2019  PCP: Catha Gosselin, MD   Narrative:  49 year old black female anxiety BMI 39 HTN seen 11/03/2019 urgent care with shortness of breath cough came to ED-?  Pneumonia Rx Rocephin doxycycline-return to ED 1/29 increasing cough S OB orthopnea increased LE swelling Covid test was negative Patient was found to have possible new onset heart failure cardiology consulted  Data Reviewed:  Echocardiogram EF 15% severe global hypokinesis CT chest pulmonary edema small to moderate pleural effusion right side greater marked cardiomegaly BUN/creatinine 14/1.0-->40/1.2 sodium 141-->133, potassium 3.7 Ultrasound lower extremity 1/29 - for DVT  Assessment & Plan:  New onset systolic heart failure acute EF 15% Cardiac cath today-defer planning and meds ot Cardiology post-cath No suspicion Takotsubo-tells me son who is 22 diagnosed with heart failure 7 mo ago Recent multifocal pneumonia Zithromax and ceftriaxone-monitor trends--no elevation of WBC-stopped 1/30 CKD stage I Monitor trends diuretics etc. Likely iron deficiency anemia Borderline microcytic with iron studies indicated need supplementation on discharge-holding iron  Anxiety Appears to be on Lamictal and gabapentin which is an odd indication this may need to be reconsidered although she states it works well for her BMI 39   Subjective: Sitting up and resting when I saw her this am Awaiting cath no cp f chill n v arm pain or diaphoresis Le swelling seems improved  Consultants:   Cardiology  Objective: Vitals:   11/18/19 1415 11/18/19 1417 11/18/19 1422 11/18/19 1432  BP: (!) 101/59 (!) 101/59 112/69 122/62  Pulse: 85 84 85 85  Resp: (!) 7 12 (!) 31 (!) 22  Temp:      TempSrc:      SpO2: 95% 98% 97% 93%  Weight:      Height:         Intake/Output Summary (Last 24 hours) at 11/18/2019 1458 Last data filed at 11/18/2019 0831 Gross per 24 hour  Intake 460 ml  Output 2150 ml  Net -1690 ml   Filed Weights   11/16/19 0357 11/17/19 0530 11/18/19 0518  Weight: 115.1 kg 113.1 kg 112.4 kg    Examination: Awake coherent no distress EOMI NCAT no jvd no brui Chest clear Abdomen soft no rebound Trace lower extremity edema left side greater than side right side Neurologically intact smile symmetric moving all 4 limbs power 5/5  Scheduled Meds: . [MAR Hold] Chlorhexidine Gluconate Cloth  6 each Topical Q0600  . [MAR Hold] enoxaparin (LOVENOX) injection  60 mg Subcutaneous Q24H  . [MAR Hold] furosemide  60 mg Intravenous Q12H  . [MAR Hold] gabapentin  100 mg Oral BID  . [MAR Hold] lamoTRIgine  400 mg Oral QHS  . [MAR Hold] mouth rinse  15 mL Mouth Rinse BID  . [MAR Hold] sacubitril-valsartan  1 tablet Oral BID  . [MAR Hold] sodium chloride flush  3 mL Intravenous Q12H  . [MAR Hold] sodium chloride flush  3 mL Intravenous Q12H  . [MAR Hold] spironolactone  12.5 mg Oral Daily   Continuous Infusions: . sodium chloride    . sodium chloride 10 mL/hr at 11/18/19 1154  . sodium chloride       LOS: 3 days   Time spent: 25 Pleas Koch, MD [facility]  11/18/2019, 2:58 PM

## 2019-11-18 NOTE — Progress Notes (Signed)
[facility] picked patient up for transport to [facility] Cath [facility].  Packet sent with patient.  Patient stable at time of transfer.  2 IV sites WNL.

## 2019-11-18 NOTE — Interval H&P Note (Signed)
History and Physical Interval Note:  11/18/2019 1:48 PM  Lisa Sellers  has presented today for surgery, with the diagnosis of heart failure.  The various methods of treatment have been discussed with the patient and family. After consideration of risks, benefits and other options for treatment, the patient has consented to  Procedure(s): RIGHT/LEFT HEART CATH AND CORONARY ANGIOGRAPHY (N/A) and possible coronary angioplasty as a surgical intervention.  The patient's history has been reviewed, patient examined, no change in status, stable for surgery.  I have reviewed the patient's chart and labs.  Questions were answered to the patient's satisfaction.     Brynlie Daza

## 2019-11-18 NOTE — Progress Notes (Signed)
[facility] arrived to transport patient back to [facility]. RN notified and post care instructions were given to pt again

## 2019-11-19 ENCOUNTER — Encounter: Payer: Self-pay | Admitting: Family Medicine

## 2019-11-19 MED ORDER — CARVEDILOL 3.125 MG PO TABS: 3.1250 mg | ORAL_TABLET | Freq: Two times a day (BID) | ORAL | 3 refills | Status: DC

## 2019-11-19 MED ORDER — SACUBITRIL-VALSARTAN 24-26 MG PO TABS: 1.0000 | ORAL_TABLET | Freq: Two times a day (BID) | ORAL | 11 refills | Status: DC

## 2019-11-19 MED ORDER — CARVEDILOL 3.125 MG PO TABS: 3.1250 mg | ORAL_TABLET | Freq: Two times a day (BID) | ORAL | Status: DC

## 2019-11-19 MED ORDER — FUROSEMIDE 20 MG PO TABS: 20.0000 mg | ORAL_TABLET | Freq: Every day | ORAL | Status: DC

## 2019-11-19 MED ORDER — FUROSEMIDE 20 MG PO TABS: 20.0000 mg | ORAL_TABLET | Freq: Every day | ORAL | 0 refills | Status: DC

## 2019-11-19 MED ORDER — SPIRONOLACTONE 25 MG PO TABS: 12.5000 mg | ORAL_TABLET | Freq: Every day | ORAL | 0 refills | Status: DC

## 2019-11-19 NOTE — Discharge Summary (Signed)
Physician Discharge Summary  Lisa Sellers ATF:[id number] DOB: 08/01/1971 DOA: 11/15/2019  PCP: Franki Cabot, MD  Admit date: 11/15/2019 Discharge date: 11/19/2019  Time spent: 35 minutes  Recommendations for Outpatient Follow-up:  1. Needs Chem-12, CBC, titration of diuretics as outpatient 2. Schedule for sleep study in the outpatient setting-wishes also to establish care close by so will ask case management to help assist with this 3. New meds Coreg Lasix Aldactone Entresto this admission   Discharge Diagnoses:  Principal Problem:   Acute CHF (congestive heart failure) (HCC) Active Problems:   Elevated brain natriuretic peptide (BNP) level   HTN (hypertension)   Anxiety   Acute exacerbation of CHF (congestive heart failure) ([place])   Discharge Condition: Improved  Diet recommendation: Heart healthy low-salt  Filed Weights   11/16/19 0357 11/17/19 0530 11/18/19 0518  Weight: 115.1 kg 113.1 kg 112.4 kg    History of present illness:  49 year old black female anxiety BMI 39 HTN seen 11/03/2019 urgent care with shortness of breath cough came to ED-?  Pneumonia Rx Rocephin doxycycline-return to ED 1/29 increasing cough S OB orthopnea increased LE swelling Covid test was negative Patient was found to have possible new onset heart failure cardiology consulted   Hospital Course:  New onset systolic heart failure acute EF 15% Cardiac cath performed 2/1 showing normal coronary, Well compensated low filling pressures with normal cath severe an ICM EF 20% cardiology recommended Coreg 3.125, Lasix on 2/3, Aldactone in addition to [place] and will follow up in the next week Recent multifocal pneumonia Zithromax and ceftriaxone-monitor trends--no elevation of WBC-stopped 1/30 CKD stage I Monitor trends diuretics etc. Likely iron deficiency anemia Borderline microcytic with iron studies indicated need supplementation on discharge-holding iron  Anxiety Appears to be on Lamictal  and gabapentin which is an odd indication this may need to be reconsidered although she states it works well for her BMI 39  Procedures:  Cardiac catheterization Assessment: 1. Normal coronary arteries 2. 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 Well compensated (low) filling pressures with normal cardiac output  Plan/Discussion:  Suspect CM likely related to OSA. Will need outpatient sleep study. BP and fluids low. Will give some fluids back post cath. I will arrange f.u in HF Clinic for next week.   Consultations:  Cardiology  Discharge Exam: Vitals:   11/19/19 0450 11/19/19 0642  BP: (!) 85/59 123/78  Pulse: 91 87  Resp: 20   Temp: 98.4 F (36.9 C)   SpO2: 97%     General: Awake alert pleasant no distress EOMI NCAT no focal deficit no lower extremity edema Cardiovascular: S1-S2 no murmur rub or gallop Respiratory: Clinically clear no rales no rhonchi Abdomen soft nontender no rebound no guarding Neurologically intact no focal deficit ROM intact  Discharge Instructions   Discharge Instructions    Diet - low sodium heart healthy   Complete by: As directed    Discharge instructions   Complete by: As directed    Notice the changes to your medications that were made this admission--they include meds to help control your heart failure and should be taken as directed by cardiology You will need labs to guide the use of some of the medications and you should adhere to salt [less than 2 g] and fluid restriction   Increase activity slowly   Complete by: As directed      Allergies as of 11/19/2019      Reactions   Penicillins Hives      Medication List  STOP taking these medications   levofloxacin 500 MG tablet Commonly known as: LEVAQUIN     TAKE these medications   carvedilol 3.125 MG tablet Commonly known as: COREG Take 1 tablet (3.125 mg total) by mouth 2 (two) times daily with a meal. Start taking on: November 20, 2019   furosemide 20 MG tablet Commonly known as:  LASIX Take 1 tablet (20 mg total) by mouth daily. Start taking on: November 20, 2019   gabapentin 100 MG capsule Commonly known as: NEURONTIN Take 100 mg by mouth 2 (two) times daily.   guaifenesin 100 MG/5ML syrup Commonly known as: ROBITUSSIN Take 200 mg by mouth 3 (three) times daily as needed for cough.   lamoTRIgine 200 MG tablet Commonly known as: LAMICTAL Take 400 mg by mouth at bedtime.   LORazepam 0.5 MG tablet Commonly known as: ATIVAN Take 0.5 mg by mouth daily as needed for anxiety.   sacubitril-valsartan 24-26 MG Commonly known as: ENTRESTO Take 1 tablet by mouth 2 (two) times daily.   sodium chloride 0.65 % Soln nasal spray Commonly known as: OCEAN Place 1 spray into both nostrils as needed for congestion.   spironolactone 25 MG tablet Commonly known as: ALDACTONE Take 0.5 tablets (12.5 mg total) by mouth daily. Start taking on: November 20, 2019      Allergies  Allergen Reactions  . Penicillins Hives   Follow-up Information    [facility]. Go on 11/26/2019.   Specialty: Cardiology Why: @10am  with Dr. PA/NP Please call for direction and parking code Contact information: [address] [place] [phone]           The results of significant diagnostics from this hospitalization (including imaging, microbiology, ancillary and laboratory) are listed below for reference.    Significant Diagnostic Studies: DG Chest 2 View  Result Date: 11/17/2019 CLINICAL DATA:  Evaluate pneumonia. EXAM: CHEST - 2 VIEW COMPARISON:  11/15/2019 FINDINGS: Lungs are hypoinflated with interval improvement in the perihilar vessels likely improving/resolving vascular congestion. No evidence of effusion. Borderline stable cardiomegaly. Remainder of the exam is unchanged. IMPRESSION: Interval improvement in the perihilar vessels suggesting improving/resolving mild vascular  congestion. Electronically Signed   By: 11/17/2019 M.D.   On: 11/17/2019 09:25   DG Chest 2 View  Result Date: 11/15/2019 CLINICAL DATA:  Recent pneumonia.  Shortness of breath. EXAM: CHEST - 2 VIEW COMPARISON:  11/03/2019 FINDINGS: Grossly unchanged enlarged cardiac silhouette and mediastinal contours. Progressive rather diffuse though perihilar predominant interstitial thickening. Minimal bibasilar heterogeneous opacities favored to represent atelectasis. No definite pleural effusion or pneumothorax. No acute osseous abnormalities. IMPRESSION: Progressive bilateral perihilar interstitial opacities, potentially pulmonary edema though atypical infection (including COVID-19) could have a similar appearance. Electronically Signed   By: 11/05/2019 M.D.   On: 11/15/2019 07:44   CT Angio Chest PE W and/or Wo Contrast  Result Date: 11/15/2019 CLINICAL DATA:  Onset shortness of breath yesterday. Oxygen desaturation. The patient reports she was discharged from the hospital 11/08/2019 after admission for pneumonia. EXAM: CT ANGIOGRAPHY CHEST WITH CONTRAST TECHNIQUE: Multidetector CT imaging of the chest was performed using the standard protocol during bolus administration of intravenous contrast. Multiplanar CT image reconstructions and MIPs were obtained to evaluate the vascular anatomy. CONTRAST:  100 mL OMNIPAQUE IOHEXOL 350 MG/ML SOLN COMPARISON:  Single-view of the chest 11/03/2019. PA and lateral chest today. FINDINGS: Cardiovascular: No pulmonary embolus is identified. There is marked cardiomegaly. Minimal aortic atherosclerosis  is seen. No aortic aneurysm or pericardial effusion. Mediastinum/Nodes: No enlarged mediastinal, hilar, or axillary lymph nodes. Thyroid gland, trachea, and esophagus demonstrate no significant findings. Lungs/Pleura: Small to moderate bilateral pleural effusions are present, greater on the right. There is scattered interlobular septal thickening and ground-glass attenuation. Mild  basilar atelectasis noted. Upper Abdomen: Status post cholecystectomy. No acute or focal abnormality. Musculoskeletal: No lytic or sclerotic lesion. No acute bony abnormality. Review of the MIP images confirms the above findings. IMPRESSION: Negative for pulmonary embolus. Findings consistent with pulmonary edema with associated small to moderate pleural effusions, larger on the right. Marked cardiomegaly also noted. Mild aortic atherosclerosis (ICD10-I70.0). Electronically Signed   By: Drusilla Kanner M.D.   On: 11/15/2019 11:30   CARDIAC CATHETERIZATION  Result Date: 11/18/2019 Findings: Ao = 87/59 (71) LV = 92/2 RA = 2 RV = 24/4 PA = 24/2 (14) PCW = 6 Fick cardiac output/index = 7.6/3.4 PVR = 1.1 WU SVR = 728 Ao sat = 95% PA sat = 64%, 64% Assessment: 1. Normal coronary arteries 2. 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 Well compensated (low) filling pressures with normal cardiac output Plan/Discussion: Suspect CM likely related to OSA. Will need outpatient sleep study. BP and fluids low. Will give some fluids back post cath. I will arrange f.u in HF Clinic for next week. Arvilla Meres, MD 2:25 PM  DG Chest Port 1 View  Result Date: 11/03/2019 CLINICAL DATA:  Cough and shortness of breath since 10/05/2019 EXAM: PORTABLE CHEST 1 VIEW COMPARISON:  None. FINDINGS: Patchy basilar and peripheral predominant airspace opacities in both lungs with low volumes and atelectatic changes. Mild cardiomegaly, likely accentuated by low volumes and portable technique. No pneumothorax. No visible effusion. No acute osseous or soft tissue abnormality. IMPRESSION: Patchy basilar and peripheral predominant airspace opacities in both lungs. Differential considerations include pneumonia, pulmonary edema, and atelectasis. Electronically Signed   By: Kreg Shropshire M.D.   On: 11/03/2019 17:04   ECHOCARDIOGRAM COMPLETE  Result Date: 11/15/2019   ECHOCARDIOGRAM REPORT   Patient Name:   KRISSA [facility] Date of Exam: 11/15/2019 Medical  Rec #:  [id number]               Height:       67.0 in Accession #:    [id number]              Weight:       260.0 lb Date of Birth:  1970/11/28               BSA:          2.26 m Patient Age:    48 years                BP:           178/131 mmHg Patient Gender: F                       HR:           98 bpm. Exam Location:  Inpatient Procedure: 2D Echo, 3D Echo, Color Doppler, Cardiac Doppler and Strain Analysis Indications:    R06.9 DOE  History:        Patient has prior history of Echocardiogram examinations, most                 recent 04/21/2018. CHF; Risk Factors:Hypertension. Prior echo                 performed at [facility].  Sonographer:    Irving Burton Senior RDCS Referring Phys: [phone] AMRIT ADHIKARI IMPRESSIONS  1. Left ventricular ejection fraction, by visual estimation, is <20%. The left ventricle has severely decreased function. There is no left ventricular hypertrophy.  2. Elevated left atrial pressure.  3. Left ventricular diastolic parameters are consistent with Grade II diastolic dysfunction (pseudonormalization).  4. Moderately dilated left ventricular internal cavity size.  5. The left ventricle demonstrates global hypokinesis.  6. Global right ventricle has mildly reduced systolic function.The right ventricular size is normal. No increase in right ventricular wall thickness.  7. Left atrial size was moderately dilated.  8. The pericardial effusion is circumferential.  9. Trivial pericardial effusion is present. 10. The mitral valve is normal in structure. Mild mitral valve regurgitation. 11. The tricuspid valve is normal in structure. Tricuspid valve regurgitation is trivial. 12. The aortic valve is normal in structure. Aortic valve regurgitation is not visualized. 13. The pulmonic valve was normal in structure. Pulmonic valve regurgitation is not visualized. 14. TR signal is inadequate for assessing pulmonary artery systolic pressure. 15. The inferior vena cava is dilated in size with <50% respiratory  variability, suggesting right atrial pressure of 15 mmHg. 16. The average left ventricular global longitudinal strain is -6.6 %. 17. Right atrial size was normal. FINDINGS  Left Ventricle: Left ventricular ejection fraction, by visual estimation, is <20%. The left ventricle has severely decreased function. The average left ventricular global longitudinal strain is -6.6 %. The left ventricle demonstrates global hypokinesis.  The left ventricular internal cavity size was moderately dilated left ventricle. There is no left ventricular hypertrophy. Left ventricular diastolic parameters are consistent with Grade II diastolic dysfunction (pseudonormalization). Elevated left atrial pressure. Right Ventricle: The right ventricular size is normal. No increase in right ventricular wall thickness. Global RV systolic function is has mildly reduced systolic function. Left Atrium: Left atrial size was moderately dilated. Right Atrium: Right atrial size was normal in size Pericardium: Trivial pericardial effusion is present. The pericardial effusion is circumferential. Mitral Valve: The mitral valve is normal in structure. Mild mitral valve regurgitation, with centrally-directed jet. Tricuspid Valve: The tricuspid valve is normal in structure. Tricuspid valve regurgitation is trivial. Aortic Valve: The aortic valve is normal in structure. Aortic valve regurgitation is not visualized. Pulmonic Valve: The pulmonic valve was normal in structure. Pulmonic valve regurgitation is not visualized. Pulmonic regurgitation is not visualized. Aorta: The aortic root and ascending aorta are structurally normal, with no evidence of dilitation. Venous: The inferior vena cava is dilated in size with less than 50% respiratory variability, suggesting right atrial pressure of 15 mmHg. IAS/Shunts: No atrial level shunt detected by color flow Doppler.  LEFT VENTRICLE PLAX 2D LVIDd:         6.20 cm       Diastology LVIDs:         5.90 cm       LV e'  lateral:   5.66 cm/s LV PW:         1.30 cm       LV E/e' lateral: 17.0 LV IVS:        1.00 cm       LV e' medial:    6.85 cm/s LVOT diam:     2.00 cm       LV E/e' medial:  14.1 LV SV:         21 ml LV SV Index:   8.59          2D Longitudinal Strain LVOT  Area:     3.14 cm      2D Strain GLS Avg:     -6.6 %  LV Volumes (MOD) LV area d, A2C:    46.30 cm LV area d, A4C:    43.00 cm LV area s, A2C:    42.50 cm LV area s, A4C:    36.00 cm LV major d, A2C:   9.54 cm LV major d, A4C:   9.83 cm LV major s, A2C:   9.38 cm LV major s, A4C:   9.22 cm LV vol d, MOD A2C: 190.0 ml LV vol d, MOD A4C: 160.0 ml LV vol s, MOD A2C: 162.0 ml LV vol s, MOD A4C: 126.0 ml LV SV MOD A2C:     28.0 ml LV SV MOD A4C:     160.0 ml LV SV MOD BP:      33.8 ml RIGHT VENTRICLE RV S prime:     8.92 cm/s TAPSE (M-mode): 2.1 cm LEFT ATRIUM             Index       RIGHT ATRIUM           Index LA diam:        4.10 cm 1.81 cm/m  RA Area:     15.40 cm LA Vol (A2C):   86.1 ml 38.08 ml/m RA Volume:   35.10 ml  15.53 ml/m LA Vol (A4C):   81.6 ml 36.09 ml/m LA Biplane Vol: 85.6 ml 37.86 ml/m  AORTIC VALVE LVOT Vmax:   47.00 cm/s LVOT Vmean:  32.500 cm/s LVOT VTI:    0.066 m  AORTA Ao Root diam: 2.70 cm Ao Asc diam:  3.10 cm MITRAL VALVE MV Area (PHT): 4.21 cm             SHUNTS MV PHT:        52.20 msec           Systemic VTI:  0.07 m MV Decel Time: 180 msec             Systemic Diam: 2.00 cm MV E velocity: 96.40 cm/s 103 cm/s MV A velocity: 28.70 cm/s 70.3 cm/s MV E/A ratio:  3.36       1.5  Mihai Croitoru MD Electronically signed by Thurmon Fair MD Signature Date/Time: 11/15/2019/2:13:57 PM    Final    VAS [place] LOWER EXTREMITY VENOUS (DVT)  Result Date: 11/16/2019  Lower Venous Study Indications: Swelling.  Risk Factors: None identified. Limitations: Body habitus and poor ultrasound/tissue interface. Comparison Study: No prior studies. Performing Technologist: Chanda Busing RVT  Examination Guidelines: A complete evaluation includes  B-mode imaging, spectral Doppler, color Doppler, and power Doppler as needed of all accessible portions of each vessel. Bilateral testing is considered an integral part of a complete examination. Limited examinations for reoccurring indications may be performed as noted.  +---------+---------------+---------+-----------+----------+--------------+ RIGHT    CompressibilityPhasicitySpontaneityPropertiesThrombus Aging +---------+---------------+---------+-----------+----------+--------------+ CFV      Full           Yes      Yes                                 +---------+---------------+---------+-----------+----------+--------------+ SFJ      Full                                                        +---------+---------------+---------+-----------+----------+--------------+  FV Prox  Full                                                        +---------+---------------+---------+-----------+----------+--------------+ FV Mid   Full                                                        +---------+---------------+---------+-----------+----------+--------------+ FV DistalFull                                                        +---------+---------------+---------+-----------+----------+--------------+ PFV      Full                                                        +---------+---------------+---------+-----------+----------+--------------+ POP      Full           Yes      Yes                                 +---------+---------------+---------+-----------+----------+--------------+ PTV      Full                                                        +---------+---------------+---------+-----------+----------+--------------+ PERO     Full                                                        +---------+---------------+---------+-----------+----------+--------------+   +---------+---------------+---------+-----------+----------+--------------+  LEFT     CompressibilityPhasicitySpontaneityPropertiesThrombus Aging +---------+---------------+---------+-----------+----------+--------------+ CFV      Full           Yes      Yes                                 +---------+---------------+---------+-----------+----------+--------------+ SFJ      Full                                                        +---------+---------------+---------+-----------+----------+--------------+ FV Prox  Full                                                        +---------+---------------+---------+-----------+----------+--------------+  FV Mid   Full                                                        +---------+---------------+---------+-----------+----------+--------------+ FV DistalFull                                                        +---------+---------------+---------+-----------+----------+--------------+ PFV      Full                                                        +---------+---------------+---------+-----------+----------+--------------+ POP      Full           Yes      Yes                                 +---------+---------------+---------+-----------+----------+--------------+ PTV      Full                                                        +---------+---------------+---------+-----------+----------+--------------+ PERO     Full                                                        +---------+---------------+---------+-----------+----------+--------------+     Summary: Right: There is no evidence of deep vein thrombosis in the lower extremity. No cystic structure found in the popliteal fossa. Left: There is no evidence of deep vein thrombosis in the lower extremity. No cystic structure found in the popliteal fossa.  *See table(s) above for measurements and observations. Electronically signed by Lemar Livings MD on 11/16/2019 at 8:40:47 AM.    Final     Microbiology: Recent  Results (from the past 240 hour(s))  SARS CORONAVIRUS 2 (TAT 6-24 HRS) Nasopharyngeal Nasopharyngeal Swab     Status: None   Collection Time: 11/15/19  9:29 AM   Specimen: Nasopharyngeal Swab  Result Value Ref Range Status   SARS Coronavirus 2 NEGATIVE NEGATIVE Final    Comment: (NOTE) SARS-CoV-2 target nucleic acids are NOT DETECTED. The SARS-CoV-2 RNA is generally detectable in upper and lower respiratory specimens during the acute phase of infection. Negative results do not preclude SARS-CoV-2 infection, do not rule out co-infections with other pathogens, and should not be used as the sole basis for treatment or other patient management decisions. Negative results must be combined with clinical observations, patient history, and epidemiological information. The expected result is Negative. Fact Sheet for Patients: HairSlick.no Fact Sheet for Healthcare Providers: quierodirigir.com This test is not yet approved or cleared by the [place] FDA and  has been authorized for detection  and/or diagnosis of SARS-CoV-2 by FDA under an Emergency Use Authorization (EUA). This EUA will remain  in effect (meaning this test can be used) for the duration of the COVID-19 declaration under Section 56 4(b)(1) of the Act, 21 U.S.C. section 360bbb-3(b)(1), unless the authorization is terminated or revoked sooner. Performed at [facility], [address], [place], [place] [address]   MRSA PCR Screening     Status: None   Collection Time: 11/15/19  6:35 PM   Specimen: Nasal Mucosa; Nasopharyngeal  Result Value Ref Range Status   MRSA by PCR NEGATIVE NEGATIVE Final    Comment:        The GeneXpert MRSA Assay (FDA approved for NASAL specimens only), is one component of a comprehensive MRSA colonization surveillance program. It is not intended to diagnose MRSA infection nor to guide or monitor treatment for MRSA  infections. Performed at [facility], [address], [place], [place] [address]      Labs: Basic Metabolic Panel: Recent Labs  Lab 11/15/19 0749 11/15/19 0749 11/16/19 0118 11/17/19 0241 11/18/19 1404 11/18/19 1409 11/18/19 1410  NA 137   < > 141 138 142 145 143  K 4.0   < > 3.7 3.6 3.4* 3.0* 3.5  CL 104  --  103 102  --   --   --   CO2 25  --  29 29  --   --   --   GLUCOSE 169*  --  128* 167*  --   --   --   BUN 14  --  14 19  --   --   --   CREATININE 0.91  --  1.01* 1.18*  --   --   --   CALCIUM 8.6*  --  8.7* 8.6*  --   --   --   PHOS  --   --   --  3.9  --   --   --    < > = values in this interval not displayed.   Liver Function Tests: Recent Labs  Lab 11/17/19 0241  ALBUMIN 3.3*   No results for input(s): LIPASE, AMYLASE in the last 168 hours. No results for input(s): AMMONIA in the last 168 hours. CBC: Recent Labs  Lab 11/15/19 1925 11/15/19 1925 11/16/19 0118 11/17/19 0241 11/18/19 1404 11/18/19 1409 11/18/19 1410  WBC 8.2  --  7.9 7.2  --   --   --   NEUTROABS 5.4  --   --  3.6  --   --   --   HGB 9.8*   < > 9.2* 9.2* 10.9* 9.9* 10.9*  HCT 34.1*   < > 31.5* 31.9* 32.0* 29.0* 32.0*  MCV 81.0  --  80.6 81.4  --   --   --   PLT 372  --  320 346  --   --   --    < > = values in this interval not displayed.   Cardiac Enzymes: No results for input(s): CKTOTAL, CKMB, CKMBINDEX, TROPONINI in the last 168 hours. BNP: BNP (last 3 results) Recent Labs    11/15/19 0749  BNP 383.3*    ProBNP (last 3 results) No results for input(s): PROBNP in the last 8760 hours.  CBG: Recent Labs  Lab 11/15/19 0627  GLUCAP 158*       Signed:  Rhetta Mura MD   [facility] 11/19/2019, 11:05 AM

## 2019-11-19 NOTE — Progress Notes (Addendum)
Progress Note  Patient Name: Lisa Sellers Date of Encounter: 11/19/2019  Primary Cardiologist:Dr. Jordan/Dr. Bensimhon   Subjective   Feeling well. No chest pain, sob or palpitations.   Inpatient Medications    Scheduled Meds: . Chlorhexidine Gluconate Cloth  6 each Topical Q0600  . enoxaparin (LOVENOX) injection  40 mg Subcutaneous Q24H  . furosemide  60 mg Intravenous Q12H  . gabapentin  100 mg Oral BID  . lamoTRIgine  400 mg Oral QHS  . mouth rinse  15 mL Mouth Rinse BID  . sacubitril-valsartan  1 tablet Oral BID  . sodium chloride flush  3 mL Intravenous Q12H  . sodium chloride flush  3 mL Intravenous Q12H  . sodium chloride flush  3 mL Intravenous Q12H  . spironolactone  12.5 mg Oral Daily   Continuous Infusions: . sodium chloride     PRN Meds: sodium chloride, acetaminophen, LORazepam, ondansetron (ZOFRAN) IV, sodium chloride flush   Vital Signs    Vitals:   11/18/19 1820 11/18/19 2046 11/19/19 0450 11/19/19 0642  BP: 137/88 (!) 106/59 (!) 85/59 123/78  Pulse: 90 100 91 87  Resp: 18  20   Temp: 98.3 F (36.8 C) 98.6 F (37 C) 98.4 F (36.9 C)   TempSrc: Oral Oral Oral   SpO2: 97% 94% 97%   Weight:      Height:        Intake/Output Summary (Last 24 hours) at 11/19/2019 0913 Last data filed at 11/19/2019 0600 Gross per 24 hour  Intake 240 ml  Output 1300 ml  Net -1060 ml   Last 3 Weights 11/18/2019 11/17/2019 11/16/2019  Weight (lbs) 247 lb 14.4 oz 249 lb 5.4 oz 253 lb 12 oz  Weight (kg) 112.447 kg 113.1 kg 115.1 kg      Telemetry    SR/ST at 90-100s- Personally Reviewed  ECG    No new tracing   Physical Exam   GEN: No acute distress.   Neck: No JVD Cardiac: RRR, no murmurs, rubs, or gallops. R radial cath with mild hematoma Respiratory: Clear to auscultation bilaterally. GI: Soft, nontender, non-distended  MS: No edema; No deformity. Neuro:  Nonfocal  Psych: Normal affect   Labs    High Sensitivity Troponin:  No results for  input(s): TROPONINIHS in the last 720 hours.    Chemistry Recent Labs  Lab 11/15/19 0749 11/15/19 0749 11/16/19 0118 11/16/19 0118 11/17/19 0241 11/17/19 0241 11/18/19 1404 11/18/19 1409 11/18/19 1410  NA 137   < > 141   < > 138   < > 142 145 143  K 4.0   < > 3.7   < > 3.6   < > 3.4* 3.0* 3.5  CL 104  --  103  --  102  --   --   --   --   CO2 25  --  29  --  29  --   --   --   --   GLUCOSE 169*  --  128*  --  167*  --   --   --   --   BUN 14  --  14  --  19  --   --   --   --   CREATININE 0.91  --  1.01*  --  1.18*  --   --   --   --   CALCIUM 8.6*  --  8.7*  --  8.6*  --   --   --   --  ALBUMIN  --   --   --   --  3.3*  --   --   --   --   GFRNONAA >60  --  >60  --  54*  --   --   --   --   GFRAA >60  --  >60  --  >60  --   --   --   --   ANIONGAP 8  --  9  --  7  --   --   --   --    < > = values in this interval not displayed.     Hematology Recent Labs  Lab 11/15/19 1925 11/15/19 1925 11/16/19 0118 11/16/19 0118 11/17/19 0241 11/17/19 0241 11/18/19 1404 11/18/19 1409 11/18/19 1410  WBC 8.2  --  7.9  --  7.2  --   --   --   --   RBC 4.21  --  3.91  --  3.92  --   --   --   --   HGB 9.8*   < > 9.2*   < > 9.2*   < > 10.9* 9.9* 10.9*  HCT 34.1*   < > 31.5*   < > 31.9*   < > 32.0* 29.0* 32.0*  MCV 81.0  --  80.6  --  81.4  --   --   --   --   MCH 23.3*  --  23.5*  --  23.5*  --   --   --   --   MCHC 28.7*  --  29.2*  --  28.8*  --   --   --   --   RDW 19.4*  --  19.3*  --  19.0*  --   --   --   --   PLT 372  --  320  --  346  --   --   --   --    < > = values in this interval not displayed.    BNP Recent Labs  Lab 11/15/19 0749  BNP 383.3*     DDimer  Recent Labs  Lab 11/15/19 0749  DDIMER 1.45*     Radiology    CARDIAC CATHETERIZATION  Result Date: 11/18/2019 Findings: Ao = 87/59 (71) LV = 92/2 RA = 2 RV = 24/4 PA = 24/2 (14) PCW = 6 Fick cardiac output/index = 7.6/3.4 PVR = 1.1 WU SVR = 728 Ao sat = 95% PA sat = 64%, 64% Assessment: 1. Normal  coronary arteries 2. 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 Well compensated (low) filling pressures with normal cardiac output Plan/Discussion: Suspect CM likely related to OSA. Will need outpatient sleep study. BP and fluids low. Will give some fluids back post cath. I will arrange f.u in HF Clinic for next week. Arvilla Meres, MD 2:25 PM   Cardiac Studies   RIGHT/LEFT HEART CATH AND CORONARY ANGIOGRAPHY 11/18/19  Conclusion  Findings:  Ao = 87/59 (71) LV = 92/2 RA = 2 RV = 24/4 PA = 24/2 (14) PCW = 6 Fick cardiac output/index = 7.6/3.4 PVR = 1.1 WU SVR = 728 Ao sat = 95% PA sat = 64%, 64%  Assessment: 1. Normal coronary arteries 2. 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 Well compensated (low) filling pressures with normal cardiac output  Plan/Discussion:  Suspect CM likely related to OSA. Will need outpatient sleep study. BP and fluids low. Will give some fluids back post cath. I will arrange f.u in HF Clinic  for next week.       Echo 11/15/19 1. Left ventricular ejection fraction, by visual estimation, is <20%. The  left ventricle has severely decreased function. There is no left  ventricular hypertrophy.  2. Elevated left atrial pressure.  3. Left ventricular diastolic parameters are consistent with Grade II  diastolic dysfunction (pseudonormalization).  4. Moderately dilated left ventricular internal cavity size.  5. The left ventricle demonstrates global hypokinesis.  6. Global right ventricle has mildly reduced systolic function.The right  ventricular size is normal. No increase in right ventricular wall  thickness.  7. Left atrial size was moderately dilated.  8. The pericardial effusion is circumferential.  9. Trivial pericardial effusion is present.  10. The mitral valve is normal in structure. Mild mitral valve  regurgitation.  11. The tricuspid valve is normal in structure. Tricuspid valve  regurgitation is trivial.  12. The aortic valve is normal in structure. Aortic  valve regurgitation is  not visualized.  13. The pulmonic valve was normal in structure. Pulmonic valve  regurgitation is not visualized.  14. TR signal is inadequate for assessing pulmonary artery systolic  pressure.  15. The inferior vena cava is dilated in size with <50% respiratory  variability, suggesting right atrial pressure of 15 mmHg.  16. The average left ventricular global longitudinal strain is -6.6 %.  17. Right atrial size was normal.   Patient Profile     49 y.o. female with history of hypertension anxiety and obesity who recently had multifocal pneumonia presented 1/29 with increased shortness of breath and cough who admitted for acute CHF exacerbation.  Covid negative.  Assessment & Plan    1.  Acute combined CHF/NICM -BNP 383.  Chest x-ray with vascular congestion.  Echocardiogram showed LV function less than 20% and grade 2 diastolic dysfunction. Cath showed normal coronaries. Suspect CM likely related to OSA. Will need outpatient sleep study. Well compensated (low) filling pressures with normal cardiac output. Given gentle fluids overnight. She got morning dose of IV lasix 60mg , Spironolactone and Entresto. BP stable on last reading 123/78. She will be ready for discharge later today. Medication recommendation per MD.  Add beta-blocker.  2.  Hypertension -Stable this morning.   For questions or updates, please contact [facility] Please consult [email or web address] for contact info under        Signed, , PA  11/19/2019, 9:13 AM    I have seen and examined the patient along with 01/17/2020, PA .  I have reviewed the chart, notes and new data.  I agree with PA/NP's note.  Key new complaints: breathing well, not dizzy Key examination changes: obesity limits the exam, but no overt hypervolemia Key new findings / data: reviewed hemodynamic cath data.  PLAN: OK for DC home. No further diuretics today. [facility] will sign off.     Medication Recommendations:  Start furosemide 20 mg daily and carvedilol 3.125 mg BID tomorrow; continue Entresto and spironolactone in current dose Other recommendations (labs, testing, etc):  BMET next week. Follow up as an outpatient:  CHF clinic f/u next week.   Manson Passey, MD, [facility] [phone] 11/19/2019, 10:34 AM

## 2019-11-26 ENCOUNTER — Ambulatory Visit (HOSPITAL_BASED_OUTPATIENT_CLINIC_OR_DEPARTMENT_OTHER)
Admission: RE | Admit: 2019-11-26 | Discharge: 2019-11-26 | Disposition: A | Discharge status: Home / Self Care | Payer: BC Managed Care – PPO | Source: Ambulatory Visit | Attending: Adult Health | Admitting: Adult Health

## 2019-11-26 ENCOUNTER — Other Ambulatory Visit (HOSPITAL_COMMUNITY): Payer: Self-pay | Admitting: Adult Health

## 2019-11-26 ENCOUNTER — Encounter (HOSPITAL_COMMUNITY): Payer: Self-pay

## 2019-11-26 ENCOUNTER — Other Ambulatory Visit: Payer: Self-pay

## 2019-11-26 ENCOUNTER — Ambulatory Visit (HOSPITAL_COMMUNITY)
Admit: 2019-11-26 | Discharge: 2019-11-26 | Disposition: A | Discharge status: Home / Self Care | Payer: BC Managed Care – PPO | Source: Ambulatory Visit | Attending: Adult Health | Admitting: Adult Health

## 2019-11-26 VITALS — BP 142/96 | HR 92 | Ht 67.0 in | Wt 251.4 lb

## 2019-11-26 DIAGNOSIS — I5022 Chronic systolic (congestive) heart failure: Secondary | ICD-10-CM | POA: Diagnosis not present

## 2019-11-26 DIAGNOSIS — Z79899 Other long term (current) drug therapy: Secondary | ICD-10-CM | POA: Diagnosis not present

## 2019-11-26 DIAGNOSIS — Z833 Family history of diabetes mellitus: Secondary | ICD-10-CM | POA: Insufficient documentation

## 2019-11-26 DIAGNOSIS — Z6839 Body mass index (BMI) 39.0-39.9, adult: Secondary | ICD-10-CM | POA: Insufficient documentation

## 2019-11-26 DIAGNOSIS — M7989 Other specified soft tissue disorders: Secondary | ICD-10-CM | POA: Insufficient documentation

## 2019-11-26 DIAGNOSIS — I428 Other cardiomyopathies: Secondary | ICD-10-CM | POA: Diagnosis not present

## 2019-11-26 DIAGNOSIS — Z8249 Family history of ischemic heart disease and other diseases of the circulatory system: Secondary | ICD-10-CM | POA: Insufficient documentation

## 2019-11-26 DIAGNOSIS — F419 Anxiety disorder, unspecified: Secondary | ICD-10-CM | POA: Diagnosis not present

## 2019-11-26 DIAGNOSIS — M79601 Pain in right arm: Secondary | ICD-10-CM

## 2019-11-26 DIAGNOSIS — I110 Hypertensive heart disease with heart failure: Secondary | ICD-10-CM | POA: Insufficient documentation

## 2019-11-26 DIAGNOSIS — I10 Essential (primary) hypertension: Secondary | ICD-10-CM | POA: Diagnosis not present

## 2019-11-26 DIAGNOSIS — E669 Obesity, unspecified: Secondary | ICD-10-CM | POA: Diagnosis not present

## 2019-11-26 DIAGNOSIS — R0683 Snoring: Secondary | ICD-10-CM | POA: Diagnosis not present

## 2019-11-26 DIAGNOSIS — M79631 Pain in right forearm: Secondary | ICD-10-CM | POA: Insufficient documentation

## 2019-11-26 LAB — BASIC METABOLIC PANEL
Anion gap: 9 (ref 5–15)
BUN: 12 mg/dL (ref 6–20)
CO2: 21 mmol/L — ABNORMAL LOW (ref 22–32)
Calcium: 9 mg/dL (ref 8.9–10.3)
Chloride: 106 mmol/L (ref 98–111)
Creatinine, Ser: 1.07 mg/dL — ABNORMAL HIGH (ref 0.44–1.00)
GFR calc Af Amer: >60 mL/min (ref >60)
GFR calc non Af Amer: >60 mL/min (ref >60)
Glucose, Bld: 161 mg/dL — ABNORMAL HIGH (ref 70–99)
Potassium: 4.5 mmol/L (ref 3.5–5.1)
Sodium: 136 mmol/L (ref 135–145)

## 2019-11-26 LAB — BRAIN NATRIURETIC PEPTIDE: B Natriuretic Peptide: 84 pg/mL (ref 0.0–100.0)

## 2019-11-26 MED ORDER — CARVEDILOL 6.25 MG PO TABS: 6.2500 mg | ORAL_TABLET | Freq: Two times a day (BID) | ORAL | 3 refills | Status: DC

## 2019-11-26 MED ORDER — ENTRESTO 49-51 MG PO TABS: 1.0000 | ORAL_TABLET | Freq: Two times a day (BID) | ORAL | 6 refills | Status: DC

## 2019-11-26 NOTE — Progress Notes (Signed)
Pt to be out of work until next week per Tonye Becket NP-C until 12/04/19 because of radial occlusion.

## 2019-11-26 NOTE — Patient Instructions (Signed)
Increase Carvedilol to 6.25 mg Twice daily   Increase Entresto 49/51 mg Twice daily   Labs done today, we will notify you of abnormal results  Ultrasound on Right wrist today  Your provider has recommended that you have a home sleep study.  [employer] is the company that does these test.  They will contact you by phone and must speak with you before they can ship the equipment.  Once they have spoken with you they will send the equipment right to your home with instructions on how to set it up.  Once you have completed the test you just dispose of the equipment, the information is automatically uploaded to [place] via blue-tooth technology.  IF you have any questions or issues with the equipment please call the company directly at [phone].  If your test is positive for sleep apnea and you need a home CPAP machine you will be contacted by Dr Norris Cross office [facility]) to set this up.  Your physician recommends that you schedule a follow-up appointment in: 2 weeks  At the [facility], you and your health needs are our priority. As part of our continuing mission to provide you with exceptional heart care, we have created designated Provider Care Teams. These Care Teams include your primary Cardiologist (physician) and Advanced Practice Providers (APPs- Physician Assistants and Nurse Practitioners) who all work together to provide you with the care you need, when you need it.   You may see any of the following providers on your designated Care Team at your next follow up: Marland Kitchen Dr Arvilla Meres . Dr Marca Ancona . Tonye Becket, NP . Robbie Lis, PA . Karle Plumber, PharmD   Please be sure to bring in all your medications bottles to every appointment.

## 2019-11-26 NOTE — Progress Notes (Signed)
PCP: Dr Dory Peru  Primary Cardiologist: Dr Gala Romney   HPI: Lisa Sellers is a 49 year old with a history of HTN and recently diagnosed NICM with reduced EF < 20%.   Admitted to [facility] on 11/15/19 with increased dyspnea and edema. ECHO completed and showed reduced LV EF <20%. Had cath to further evaluate and this showed normal coronaries, severe NICM, and normal cardiac output. Diuresed with IV lasix and HF meds.   Today she returns for HF follow up.Overall feeling fine other than right forearm pain. Says has had swelling in R forearm and pain since her cath on 11/18/19. Mild SOB with exertion. Has sensation in R hand. Denies PND/Orthopnea. Appetite ok. Tries to follow low salt diet.  No fever or chills. Weight at home 245-248  pounds. Taking all medications. 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   ECHO 11/15/19-->LV EF <20% RV mildly reduced   RHC 11/18/19 Ao = 87/59 (71) LV = 92/2 RA = 2 RV = 24/4 PA = 24/2 (14) PCW = 6 Fick cardiac output/index = 7.6/3.4 PVR = 1.1 WU SVR = 728 Ao sat = 95% PA sat = 64%, 64% Assessment: 1. Normal coronary arteries 2. 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 Well compensated (low) filling pressures with normal cardiac output   ROS: All systems negative except as listed in HPI, PMH and Problem List.  SH:  Social History   Socioeconomic History  . Marital status: Married    Spouse name: Not on file  . Number of children: 1  . Years of education: Not on file  . Highest education level: Not on file  Occupational History  . Not on file  Tobacco Use  . Smoking status: Never Smoker  . Smokeless tobacco: Never Used  Substance and Sexual Activity  . Alcohol use: No    Alcohol/week: 0.0 standard drinks  . Drug use: No  . Sexual activity: Not on file  Other Topics Concern  . Not on file  Social History Narrative  . Not on file   Social Determinants of Health   Financial Resource Strain:   . Difficulty of Paying Living Expenses: Not on file  Food  Insecurity:   . Worried About Programme researcher, broadcasting/film/video in the Last Year: Not on file  . Ran Out of Food in the Last Year: Not on file  Transportation Needs:   . Lack of Transportation (Medical): Not on file  . Lack of Transportation (Non-Medical): Not on file  Physical Activity:   . Days of Exercise per Week: Not on file  . Minutes of Exercise per Session: Not on file  Stress:   . Feeling of Stress : Not on file  Social Connections:   . Frequency of Communication with Friends and Family: Not on file  . Frequency of Social Gatherings with Friends and Family: Not on file  . Attends Religious Services: Not on file  . Active Member of Clubs or Organizations: Not on file  . Attends Banker Meetings: Not on file  . Marital Status: Not on file  Intimate Partner Violence:   . Fear of Current or Ex-Partner: Not on file  . Emotionally Abused: Not on file  . Physically Abused: Not on file  . Sexually Abused: Not on file    FH:  Family History  Problem Relation Age of Onset  . Heart failure Father   . Diabetes Father   . Congestive Heart Failure Other     Past Medical History:  Diagnosis Date  . Anxiety   . HTN (hypertension)     Current Outpatient Medications  Medication Sig Dispense Refill  . carvedilol (COREG) 3.125 MG tablet Take 1 tablet (3.125 mg total) by mouth 2 (two) times daily with a meal. 60 tablet 3  . furosemide (LASIX) 20 MG tablet Take 1 tablet (20 mg total) by mouth daily. 30 tablet 0  . gabapentin (NEURONTIN) 100 MG capsule Take 100 mg by mouth 2 (two) times daily.    Marland Kitchen guaifenesin (ROBITUSSIN) 100 MG/5ML syrup Take 200 mg by mouth 3 (three) times daily as needed for cough.    . lamoTRIgine (LAMICTAL) 200 MG tablet Take 400 mg by mouth at bedtime.     Marland Kitchen LORazepam (ATIVAN) 0.5 MG tablet Take 0.5 mg by mouth daily as needed for anxiety.     . sacubitril-valsartan (ENTRESTO) 24-26 MG Take 1 tablet by mouth 2 (two) times daily. 60 tablet 11  . spironolactone  (ALDACTONE) 25 MG tablet Take 0.5 tablets (12.5 mg total) by mouth daily. 30 tablet 0  . sodium chloride (OCEAN) 0.65 % SOLN nasal spray Place 1 spray into both nostrils as needed for congestion.     No current facility-administered medications for this encounter.    Vitals:   11/26/19 1008  BP: (!) 142/96  Pulse: 92  SpO2: 96%  Weight: 114 kg (251 lb 6.4 oz)  Height: 5\' 7"  (1.702 m)   Wt Readings from Last 3 Encounters:  11/26/19 114 kg (251 lb 6.4 oz)  11/18/19 112.4 kg (247 lb 14.4 oz)  11/03/19 114.8 kg (253 lb)    PHYSICAL EXAM: General:  Well appearing. No resp difficulty HEENT: normal Neck: supple. JVP 7-8 . Carotids 2+ bilaterally; no bruits. No lymphadenopathy or thryomegaly appreciated. Cor: PMI normal. Regular rate & rhythm. No rubs, gallops or murmurs. Lungs: clear Abdomen: soft, nontender, nondistended. No hepatosplenomegaly. No bruits or masses. Good bowel sounds. Extremities: no cyanosis, clubbing, rash, edema. RUE forearm tender. R hand warm with intact sensation. Unable to palpate r radial pulse.  Neuro: alert & orientedx3, cranial nerves grossly intact. Moves all 4 extremities w/o difficulty. Affect pleasant.   ASSESSMENT & PLAN:  1. Chronic Systolic HF, NICM Had LHC/RHC on 11/18/19 with normal coronaries and preserved cardiac output. ECHO 1/29/221 showed LVEF was down < 20% suspect due to HTN and OSA.  NYHA III. Volume status stable.  Continue lasix 20 mg daily  - Increase carvedilol 6.25 mg twice a day - Increase entresto 49-51 mg twice a day  - Check BMET  - Plan to repeat ECHO after HF meds optimized. Discussed low salt food choices and to limit fluids to < 2 liters per day.   2. HTN Uncontrolled  - Increase bb and arni as noted above.   3. Snores Concerned she has sleep apnea.  Set up for sleep study   4. Obesity  Body mass index is 39.37 kg/m.  Discussed portion control.   5. R forearm pain Had LHC on 11/18/19 Sent for stat doppler.  Sensation intact on exam. Concerned for blockage.   Follow up in 2 weeks. Note provided for work.  Miyoshi Ligas NP-C  5:29 PM

## 2019-11-26 NOTE — Progress Notes (Signed)
Upper extremity pseudo check has been completed.   Preliminary results in CV Proc.   Blanch Media 11/26/2019 11:47 AM

## 2019-11-29 ENCOUNTER — Telehealth (HOSPITAL_COMMUNITY): Payer: Self-pay

## 2019-11-29 NOTE — Telephone Encounter (Signed)
[employer] paperwork completed and signed. Faxed to [phone]. Fax confirmation received.

## 2019-11-29 NOTE — Telephone Encounter (Signed)
fmla paper work filled out for pt's husband Latrenda Irani. Completed and faxed to [facility] at [phone]. Fax confirmation received.   Received paper work for pt for [employer]. Paper work to be signed by MD. pending

## 2019-12-07 DIAGNOSIS — D509 Iron deficiency anemia, unspecified: Secondary | ICD-10-CM | POA: Insufficient documentation

## 2019-12-07 HISTORY — DX: Iron deficiency anemia, unspecified: D50.9

## 2019-12-09 NOTE — Progress Notes (Signed)
PCP: Dr Truman Hayward Primary Cardiologist: Dr Haroldine Laws   HPI: Ms Oldaker is a 49 year old with a history of HTN and recently diagnosed NICM with reduced EF < 20%.   Admitted to [facility] on 11/15/19 with increased dyspnea and edema. ECHO completed and showed reduced LV EF <20%. Had cath to further evaluate and this showed normal coronaries, severe NICM, and normal cardiac output. Diuresed with IV lasix and HF meds.   On 11/25/19 she had doppler of RUE due to pain. This showed R radial occlusion.   Today she returns for HF follow up. Overall feeling much better. R wrist pain resolving.  Last visit carvedilol and entresto increased. SOB, mild walking in the grocery store. Denies PND/Orthopnea. Able to walk 15 minutes without stopping.  Appetite ok. No fever or chills. Weight at home 245-247  pounds. Taking all medications. Working full time at home.   ECHO 11/15/19-->LV EF <20% RV mildly reduced   RHC 11/18/19 Ao = 87/59 (71) LV = 92/2 RA = 2 RV = 24/4 PA = 24/2 (14) PCW = 6 Fick cardiac output/index = 7.6/3.4 PVR = 1.1 WU SVR = 728 Ao sat = 95% PA sat = 64%, 64% Assessment: 1. Normal coronary arteries 2. 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 Well compensated (low) filling pressures with normal cardiac output   ROS: All systems negative except as listed in HPI, PMH and Problem List.  SH:  Social History   Socioeconomic History  . Marital status: Married    Spouse name: Not on file  . Number of children: 1  . Years of education: Not on file  . Highest education level: Not on file  Occupational History  . Not on file  Tobacco Use  . Smoking status: Never Smoker  . Smokeless tobacco: Never Used  Substance and Sexual Activity  . Alcohol use: No    Alcohol/week: 0.0 standard drinks  . Drug use: No  . Sexual activity: Not on file  Other Topics Concern  . Not on file  Social History Narrative  . Not on file   Social Determinants of Health   Financial Resource Strain:   . Difficulty of Paying  Living Expenses: Not on file  Food Insecurity:   . Worried About Charity fundraiser in the Last Year: Not on file  . Ran Out of Food in the Last Year: Not on file  Transportation Needs:   . Lack of Transportation (Medical): Not on file  . Lack of Transportation (Non-Medical): Not on file  Physical Activity:   . Days of Exercise per Week: Not on file  . Minutes of Exercise per Session: Not on file  Stress:   . Feeling of Stress : Not on file  Social Connections:   . Frequency of Communication with Friends and Family: Not on file  . Frequency of Social Gatherings with Friends and Family: Not on file  . Attends Religious Services: Not on file  . Active Member of Clubs or Organizations: Not on file  . Attends Archivist Meetings: Not on file  . Marital Status: Not on file  Intimate Partner Violence:   . Fear of Current or Ex-Partner: Not on file  . Emotionally Abused: Not on file  . Physically Abused: Not on file  . Sexually Abused: Not on file    FH:  Family History  Problem Relation Age of Onset  . Heart failure Father   . Diabetes Father   . Congestive Heart Failure Other  Past Medical History:  Diagnosis Date  . Anxiety   . HTN (hypertension)     Current Outpatient Medications  Medication Sig Dispense Refill  . carvedilol (COREG) 6.25 MG tablet Take 1 tablet (6.25 mg total) by mouth 2 (two) times daily with a meal. 60 tablet 3  . furosemide (LASIX) 20 MG tablet Take 1 tablet (20 mg total) by mouth daily. 30 tablet 0  . gabapentin (NEURONTIN) 100 MG capsule Take 100 mg by mouth 2 (two) times daily.    Marland Kitchen guaifenesin (ROBITUSSIN) 100 MG/5ML syrup Take 200 mg by mouth 3 (three) times daily as needed for cough.    . lamoTRIgine (LAMICTAL) 200 MG tablet Take 400 mg by mouth at bedtime.     Marland Kitchen LORazepam (ATIVAN) 0.5 MG tablet Take 0.5 mg by mouth daily as needed for anxiety.     . sacubitril-valsartan (ENTRESTO) 49-51 MG Take 1 tablet by mouth 2 (two) times  daily. 60 tablet 6  . spironolactone (ALDACTONE) 25 MG tablet Take 0.5 tablets (12.5 mg total) by mouth daily. 30 tablet 0   No current facility-administered medications for this encounter.    Vitals:   12/10/19 0825  BP: (!) 142/102  Pulse: (!) 102  SpO2: 97%  Weight: 114.4 kg (252 lb 3.2 oz)   Wt Readings from Last 3 Encounters:  12/10/19 114.4 kg (252 lb 3.2 oz)  11/26/19 114 kg (251 lb 6.4 oz)  11/18/19 112.4 kg (247 lb 14.4 oz)    PHYSICAL EXAM: General:  Well appearing. No resp difficulty HEENT: normal Neck: supple. no JVD. Carotids 2+ bilat; no bruits. No lymphadenopathy or thryomegaly appreciated. Cor: PMI nondisplaced. Regular rate & rhythm. No rubs, gallops or murmurs. Lungs: clear Abdomen: obese, soft, nontender, nondistended. No hepatosplenomegaly. No bruits or masses. Good bowel sounds. Extremities: no cyanosis, clubbing, rash, edema Neuro: alert & orientedx3, cranial nerves grossly intact. moves all 4 extremities w/o difficulty. Affect pleasant  ASSESSMENT & PLAN: 1. Chronic Systolic HF, NICM Had LHC/RHC on 11/18/19 with normal coronaries and preserved cardiac output. ECHO 11/15/19 showed LVEF was down < 20% suspect due to HTN and OSA.  NYHA III. Volume status stable. Continue lasix 20 mg daily  - Increase carvedilol  9.375 mg mg twice a day - Increase entresto 97-103  mg twice a day  - Plan to repeat ECHO after HF meds optimized.  Discussed low salt food choices and to limit fluids to < 2 liters per day.  - Check BMET today   2. HTN Remains high.  - Increase entresto and carvedilol today.    3. Snores Concerned she has sleep apnea.  - Sleep study was set up but waiting on the kit.   4. Obesity  Body mass index is 39.5 kg/m.  Discussed portion control.   5. R forearm pain -Had LHC on 11/18/19 -11/25/19 Had R radial occlusion - Wrist/forearm pain resolving.   Follow up in 2 weeks. Anticipate adding spiro/bidil.    Lashon Hillier NP-C  8:55 AM

## 2019-12-10 ENCOUNTER — Ambulatory Visit (HOSPITAL_COMMUNITY)
Admission: RE | Admit: 2019-12-10 | Discharge: 2019-12-10 | Disposition: A | Discharge status: Home / Self Care | Payer: BC Managed Care – PPO | Source: Ambulatory Visit | Attending: Adult Health | Admitting: Adult Health

## 2019-12-10 ENCOUNTER — Encounter (HOSPITAL_COMMUNITY): Payer: Self-pay

## 2019-12-10 ENCOUNTER — Other Ambulatory Visit: Payer: Self-pay

## 2019-12-10 VITALS — BP 142/102 | HR 102 | Wt 252.2 lb

## 2019-12-10 DIAGNOSIS — F419 Anxiety disorder, unspecified: Secondary | ICD-10-CM | POA: Diagnosis not present

## 2019-12-10 DIAGNOSIS — I5022 Chronic systolic (congestive) heart failure: Secondary | ICD-10-CM | POA: Diagnosis not present

## 2019-12-10 DIAGNOSIS — I10 Essential (primary) hypertension: Secondary | ICD-10-CM | POA: Diagnosis not present

## 2019-12-10 DIAGNOSIS — Z7901 Long term (current) use of anticoagulants: Secondary | ICD-10-CM | POA: Insufficient documentation

## 2019-12-10 DIAGNOSIS — M79631 Pain in right forearm: Secondary | ICD-10-CM | POA: Diagnosis not present

## 2019-12-10 DIAGNOSIS — M79601 Pain in right arm: Secondary | ICD-10-CM

## 2019-12-10 DIAGNOSIS — I428 Other cardiomyopathies: Secondary | ICD-10-CM | POA: Diagnosis not present

## 2019-12-10 DIAGNOSIS — Z79899 Other long term (current) drug therapy: Secondary | ICD-10-CM | POA: Diagnosis not present

## 2019-12-10 DIAGNOSIS — R0602 Shortness of breath: Secondary | ICD-10-CM | POA: Insufficient documentation

## 2019-12-10 DIAGNOSIS — M25531 Pain in right wrist: Secondary | ICD-10-CM | POA: Insufficient documentation

## 2019-12-10 DIAGNOSIS — Z8249 Family history of ischemic heart disease and other diseases of the circulatory system: Secondary | ICD-10-CM | POA: Diagnosis not present

## 2019-12-10 DIAGNOSIS — E669 Obesity, unspecified: Secondary | ICD-10-CM | POA: Insufficient documentation

## 2019-12-10 DIAGNOSIS — Z6839 Body mass index (BMI) 39.0-39.9, adult: Secondary | ICD-10-CM | POA: Diagnosis not present

## 2019-12-10 DIAGNOSIS — R0683 Snoring: Secondary | ICD-10-CM | POA: Diagnosis not present

## 2019-12-10 DIAGNOSIS — I110 Hypertensive heart disease with heart failure: Secondary | ICD-10-CM | POA: Diagnosis not present

## 2019-12-10 LAB — BASIC METABOLIC PANEL
Anion gap: 10 (ref 5–15)
BUN: 15 mg/dL (ref 6–20)
CO2: 23 mmol/L (ref 22–32)
Calcium: 8.8 mg/dL — ABNORMAL LOW (ref 8.9–10.3)
Chloride: 103 mmol/L (ref 98–111)
Creatinine, Ser: 1.05 mg/dL — ABNORMAL HIGH (ref 0.44–1.00)
GFR calc Af Amer: >60 mL/min (ref >60)
GFR calc non Af Amer: >60 mL/min (ref >60)
Glucose, Bld: 219 mg/dL — ABNORMAL HIGH (ref 70–99)
Potassium: 3.8 mmol/L (ref 3.5–5.1)
Sodium: 136 mmol/L (ref 135–145)

## 2019-12-10 MED ORDER — CARVEDILOL 6.25 MG PO TABS: 9.3750 mg | ORAL_TABLET | Freq: Two times a day (BID) | ORAL | 3 refills | Status: DC

## 2019-12-10 MED ORDER — SACUBITRIL-VALSARTAN 97-103 MG PO TABS: 1.0000 | ORAL_TABLET | Freq: Two times a day (BID) | ORAL | 11 refills | Status: DC

## 2019-12-10 NOTE — Patient Instructions (Signed)
Lab work done today. We will notify you of any abnormal lab work. No news is good news!  INCREASE Entresto to 97/103mg  tab two times daily.  INCREASE Carvedilol to 9.375mg  (1.5 tab) two times daily.  Please follow up with the [facility] in 2 weeks.  At the [facility], you and your health needs are our priority. As part of our continuing mission to provide you with exceptional heart care, we have created designated Provider Care Teams. These Care Teams include your primary Cardiologist (physician) and Advanced Practice Providers (APPs- Physician Assistants and Nurse Practitioners) who all work together to provide you with the care you need, when you need it.   You may see any of the following providers on your designated Care Team at your next follow up: Marland Kitchen Dr Arvilla Meres . Dr Marca Ancona . Tonye Becket, NP . Robbie Lis, PA . Karle Plumber, PharmD   Please be sure to bring in all your medications bottles to every appointment.

## 2019-12-11 ENCOUNTER — Telehealth (HOSPITAL_COMMUNITY): Payer: Self-pay

## 2019-12-11 ENCOUNTER — Other Ambulatory Visit (HOSPITAL_COMMUNITY): Payer: Self-pay | Admitting: Adult Health

## 2019-12-11 NOTE — Telephone Encounter (Signed)
**  REFAXED**     Order, OV note, stop bang and demographics all faxed to [employer] at [phone] via epic   CONFIRMATION RECEIVED.

## 2019-12-11 NOTE — Telephone Encounter (Signed)
At pt appt 12/10/19 pt requested disability to be revised and resent. Revision made. Faxed with confirmation received. Original to be scanned into chart.

## 2019-12-11 NOTE — Progress Notes (Signed)
Patient Name:         DOB:       Height:     Weight:  Office Name:         Referring Provider:  Today's Date:  Date:   STOP BANG RISK ASSESSMENT S (snore) Have you been told that you snore?     YES   T (tired) Are you often tired, fatigued, or sleepy during the day?   YES  O (obstruction) Do you stop breathing, choke, or gasp during sleep? YES/NO   P (pressure) Do you have or are you being treated for high blood pressure? YES   B (BMI) Is your body index greater than 35 kg/m? YES   A (age) Are you 26 years old or older? YES/NO   N (neck) Do you have a neck circumference greater than 16 inches?   YES/NO   G (gender) Are you a female? YES/NO   TOTAL STOP/BANG "YES" ANSWERS                                                                        For Office Use Only              Procedure Order Form    YES to 3+ Stop Bang questions OR two clinical symptoms - patient qualifies for WatchPAT (CPT 95800)     Submit: This Form + Patient Face Sheet + Clinical Note via CloudPAT or Fax: [phone]         Clinical Notes: Will consult Sleep Specialist and refer for management of therapy due to patient increased risk of Sleep Apnea. Ordering a sleep study due to the following two clinical symptoms: Excessive daytime sleepiness G47.10 / Gastroesophageal reflux K21.9 / Nocturia R35.1 / Morning Headaches G44.221 / Difficulty concentrating R41.840 / Memory problems or poor judgment G31.84 / Personality changes or irritability R45.4 / Loud snoring R06.83 / Depression F32.9 / Unrefreshed by sleep G47.8 / Impotence N52.9 / History of high blood pressure R03.0 / Insomnia G47.00    I understand that I am proceeding with a home sleep apnea test as ordered by my treating physician. I understand that untreated sleep apnea is a serious cardiovascular risk factor and it is my responsibility to perform the test and seek management for sleep apnea. I will be contacted with the results and be managed for  sleep apnea by a local sleep physician. I will be receiving equipment and further instructions from [facility]. I shall promptly ship back the equipment via the included mailing label. I understand my insurance will be billed for the test and as the patient I am responsible for any insurance related out-of-pocket costs incurred. I have been provided with written instructions and can call for additional video or telephonic instruction, with 24-hour availability of qualified personnel to answer any questions: Patient Help Desk [phone].  _ Patient Telemedicine Verbal Consent

## 2019-12-11 NOTE — Addendum Note (Signed)
Encounter addended by: Nicole Cella, RN on: 12/11/2019 12:31 PM  Actions taken: Clinical Note Signed

## 2019-12-23 ENCOUNTER — Ambulatory Visit (HOSPITAL_COMMUNITY)
Admission: RE | Admit: 2019-12-23 | Discharge: 2019-12-23 | Disposition: A | Discharge status: Home / Self Care | Payer: BC Managed Care – PPO | Source: Ambulatory Visit | Attending: Adult Health | Admitting: Adult Health

## 2019-12-23 ENCOUNTER — Encounter (HOSPITAL_COMMUNITY): Payer: Self-pay

## 2019-12-23 ENCOUNTER — Other Ambulatory Visit: Payer: Self-pay

## 2019-12-23 VITALS — BP 142/92 | HR 86 | Wt 252.5 lb

## 2019-12-23 DIAGNOSIS — I5022 Chronic systolic (congestive) heart failure: Secondary | ICD-10-CM | POA: Insufficient documentation

## 2019-12-23 DIAGNOSIS — Z8249 Family history of ischemic heart disease and other diseases of the circulatory system: Secondary | ICD-10-CM | POA: Diagnosis not present

## 2019-12-23 DIAGNOSIS — E669 Obesity, unspecified: Secondary | ICD-10-CM | POA: Diagnosis not present

## 2019-12-23 DIAGNOSIS — I10 Essential (primary) hypertension: Secondary | ICD-10-CM

## 2019-12-23 DIAGNOSIS — Z6839 Body mass index (BMI) 39.0-39.9, adult: Secondary | ICD-10-CM | POA: Insufficient documentation

## 2019-12-23 DIAGNOSIS — I428 Other cardiomyopathies: Secondary | ICD-10-CM | POA: Insufficient documentation

## 2019-12-23 DIAGNOSIS — I110 Hypertensive heart disease with heart failure: Secondary | ICD-10-CM | POA: Insufficient documentation

## 2019-12-23 DIAGNOSIS — M79601 Pain in right arm: Secondary | ICD-10-CM

## 2019-12-23 DIAGNOSIS — Z79899 Other long term (current) drug therapy: Secondary | ICD-10-CM | POA: Insufficient documentation

## 2019-12-23 DIAGNOSIS — F419 Anxiety disorder, unspecified: Secondary | ICD-10-CM | POA: Diagnosis not present

## 2019-12-23 DIAGNOSIS — R0683 Snoring: Secondary | ICD-10-CM | POA: Insufficient documentation

## 2019-12-23 DIAGNOSIS — M79631 Pain in right forearm: Secondary | ICD-10-CM | POA: Diagnosis not present

## 2019-12-23 DIAGNOSIS — Z833 Family history of diabetes mellitus: Secondary | ICD-10-CM | POA: Diagnosis not present

## 2019-12-23 LAB — BASIC METABOLIC PANEL
Anion gap: 7 (ref 5–15)
BUN: 13 mg/dL (ref 6–20)
CO2: 23 mmol/L (ref 22–32)
Calcium: 8.6 mg/dL — ABNORMAL LOW (ref 8.9–10.3)
Chloride: 108 mmol/L (ref 98–111)
Creatinine, Ser: 1.11 mg/dL — ABNORMAL HIGH (ref 0.44–1.00)
GFR calc Af Amer: >60 mL/min (ref >60)
GFR calc non Af Amer: 59 mL/min — ABNORMAL LOW (ref >60)
Glucose, Bld: 190 mg/dL — ABNORMAL HIGH (ref 70–99)
Potassium: 3.8 mmol/L (ref 3.5–5.1)
Sodium: 138 mmol/L (ref 135–145)

## 2019-12-23 MED ORDER — SPIRONOLACTONE 25 MG PO TABS: 25.0000 mg | ORAL_TABLET | Freq: Every day | ORAL | 3 refills | Status: DC

## 2019-12-23 MED ORDER — ISOSORB DINITRATE-HYDRALAZINE 20-37.5 MG PO TABS: 1.0000 | ORAL_TABLET | Freq: Three times a day (TID) | ORAL | 3 refills | Status: DC

## 2019-12-23 NOTE — Progress Notes (Signed)
PCP: Dr Truman Hayward Primary Cardiologist: Dr Haroldine Laws   HPI: Ms Convery is a 49 year old with a history of HTN, bipolar, and recently diagnosed NICM with reduced EF < 20%.   Admitted to [facility] on 11/15/19 with increased dyspnea and edema. ECHO completed and showed reduced LV EF <20%. Had cath to further evaluate and this showed normal coronaries, severe NICM, and normal cardiac output. Diuresed with IV lasix and HF meds.   On 11/25/19 she had doppler of RUE due to pain. This showed R radial occlusion.   Home Sleep Study ordered.  Today she returns for HF follow up. Last visit entresto and carvedilol increased. Overall feeling fine. SOB with exertion. Denies PND/Orthopnea. Appetite ok. No fever or chills. Weight at home 246-250 pounds. SBP at home 140-150.  Walking 15-20 minutes a few days a week. Taking all medications.   ECHO 11/15/19-->LV EF <20% RV mildly reduced   RHC 11/18/19 Ao = 87/59 (71) LV = 92/2 RA = 2 RV = 24/4 PA = 24/2 (14) PCW = 6 Fick cardiac output/index = 7.6/3.4 PVR = 1.1 WU SVR = 728 Ao sat = 95% PA sat = 64%, 64% Assessment: 1. Normal coronary arteries 2. 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 Well compensated (low) filling pressures with normal cardiac output   ROS: All systems negative except as listed in HPI, PMH and Problem List.  SH:  Social History   Socioeconomic History  . Marital status: Married    Spouse name: Not on file  . Number of children: 1  . Years of education: Not on file  . Highest education level: Not on file  Occupational History  . Not on file  Tobacco Use  . Smoking status: Never Smoker  . Smokeless tobacco: Never Used  Substance and Sexual Activity  . Alcohol use: No    Alcohol/week: 0.0 standard drinks  . Drug use: No  . Sexual activity: Not on file  Other Topics Concern  . Not on file  Social History Narrative  . Not on file   Social Determinants of Health   Financial Resource Strain:   . Difficulty of Paying Living Expenses: Not on file   Food Insecurity:   . Worried About Charity fundraiser in the Last Year: Not on file  . Ran Out of Food in the Last Year: Not on file  Transportation Needs:   . Lack of Transportation (Medical): Not on file  . Lack of Transportation (Non-Medical): Not on file  Physical Activity:   . Days of Exercise per Week: Not on file  . Minutes of Exercise per Session: Not on file  Stress:   . Feeling of Stress : Not on file  Social Connections:   . Frequency of Communication with Friends and Family: Not on file  . Frequency of Social Gatherings with Friends and Family: Not on file  . Attends Religious Services: Not on file  . Active Member of Clubs or Organizations: Not on file  . Attends Archivist Meetings: Not on file  . Marital Status: Not on file  Intimate Partner Violence:   . Fear of Current or Ex-Partner: Not on file  . Emotionally Abused: Not on file  . Physically Abused: Not on file  . Sexually Abused: Not on file    FH:  Family History  Problem Relation Age of Onset  . Heart failure Father   . Diabetes Father   . Congestive Heart Failure Other     Past Medical  History:  Diagnosis Date  . Anxiety   . HTN (hypertension)     Current Outpatient Medications  Medication Sig Dispense Refill  . carvedilol (COREG) 6.25 MG tablet Take 1.5 tablets (9.375 mg total) by mouth 2 (two) times daily with a meal. 270 tablet 3  . Ferrous Fumarate (IRON) 18 MG TBCR Take by mouth.    . furosemide (LASIX) 20 MG tablet TAKE 1 TABLET BY MOUTH EVERY DAY 30 tablet 3  . gabapentin (NEURONTIN) 100 MG capsule Take 100 mg by mouth 2 (two) times daily.    Marland Kitchen lamoTRIgine (LAMICTAL) 200 MG tablet Take 400 mg by mouth at bedtime.     Marland Kitchen LORazepam (ATIVAN) 0.5 MG tablet Take 0.5 mg by mouth daily as needed for anxiety.     . sacubitril-valsartan (ENTRESTO) 97-103 MG Take 1 tablet by mouth 2 (two) times daily. 60 tablet 11  . spironolactone (ALDACTONE) 25 MG tablet Take 0.5 tablets (12.5 mg  total) by mouth daily. 30 tablet 0  . guaifenesin (ROBITUSSIN) 100 MG/5ML syrup Take 200 mg by mouth 3 (three) times daily as needed for cough.     No current facility-administered medications for this encounter.    Vitals:   12/23/19 0831  BP: (!) 142/92  Pulse: 86  SpO2: 98%  Weight: 114.5 kg (252 lb 8 oz)   Wt Readings from Last 3 Encounters:  12/23/19 114.5 kg (252 lb 8 oz)  12/10/19 114.4 kg (252 lb 3.2 oz)  11/26/19 114 kg (251 lb 6.4 oz)    PHYSICAL EXAM: General:  Well appearing. No resp difficulty HEENT: normal Neck: supple. no JVD. Carotids 2+ bilat; no bruits. No lymphadenopathy or thryomegaly appreciated. Cor: PMI nondisplaced. Regular rate & rhythm. No rubs, gallops or murmurs. Lungs: clear Abdomen: obese, soft, nontender, nondistended. No hepatosplenomegaly. No bruits or masses. Good bowel sounds. Extremities: no cyanosis, clubbing, rash, edema Neuro: alert & orientedx3, cranial nerves grossly intact. moves all 4 extremities w/o difficulty. Affect pleasant  ASSESSMENT & PLAN: 1. Chronic Systolic HF, NICM Had LHC/RHC on 11/18/19 with normal coronaries and preserved cardiac output. ECHO 11/15/19 showed LVEF was down < 20% suspect due to HTN and OSA.  - NYHA III. Volume status stable.  Continue lasix 20 mg daily  - Continue carvedilol  9.375 mg mg twice a day - Continue entresto 97-103  mg twice a day  - Increase spironolactone to 25 mg daily.  - Add Bidil 1 tab three times a day.  - Plan to repeat ECHO after HF meds optimized.  - Check BMET today and in 10-14 days    2. HTN - Remains elevated.  - Add bidil and spiro   3. Snores Concerned she has sleep apnea.  - Sleep study pending.    4. Obesity  Body mass index is 39.55 kg/m.  Discussed portion control.   5. R forearm pain -Had LHC on 11/18/19 -11/25/19 Had R radial occlusion - Wrist/forearm pain resolving.   Follow up in 2 weeks to continue and titrate meds. Sleep study pending. Note provided for work.  Discussed all medication changes.    Pascuala Klutts NP-C  8:40 AM

## 2019-12-23 NOTE — Patient Instructions (Signed)
Lab work done today. We will notify you of any abnormal lab work. No news is good news!  START Bidil 20/37.5mg  tab three times daily.  INCREASE Spironolactone to 25mg  tab daily.  You have been given a letter to remove you from work.  Please follow up with the [facility] in 2 weeks.  At the [facility], you and your health needs are our priority. As part of our continuing mission to provide you with exceptional heart care, we have created designated Provider Care Teams. These Care Teams include your primary Cardiologist (physician) and Advanced Practice Providers (APPs- Physician Assistants and Nurse Practitioners) who all work together to provide you with the care you need, when you need it.   You may see any of the following providers on your designated Care Team at your next follow up: Dr Marland Kitchen . Dr Arvilla Meres . Marca Ancona, NP . Tonye Becket, PA . Robbie Lis, PharmD   Please be sure to bring in all your medications bottles to every appointment.

## 2019-12-23 NOTE — Progress Notes (Signed)
Letter to keep pt out of work until further notice.

## 2019-12-25 ENCOUNTER — Encounter (INDEPENDENT_AMBULATORY_CARE_PROVIDER_SITE_OTHER): Payer: BC Managed Care – PPO | Admitting: Cardiology

## 2019-12-25 DIAGNOSIS — G4733 Obstructive sleep apnea (adult) (pediatric): Secondary | ICD-10-CM

## 2020-01-02 ENCOUNTER — Encounter (HOSPITAL_COMMUNITY): Payer: Self-pay | Admitting: *Deleted

## 2020-01-02 NOTE — Progress Notes (Signed)
Pt's disability & leave statement completed and signed by Dr Gala Romney, form and last OV faxed to [facility] at [phone].  Pt is aware this has been and copy mailed to her per her request.

## 2020-01-06 NOTE — Progress Notes (Signed)
PCP: Dr Truman Hayward Primary Cardiologist: Dr Haroldine Laws   HPI: Lisa Sellers is a 49 year old with a history of HTN, bipolar, and recently diagnosed NICM with reduced EF < 20%.   Admitted to [facility] on 11/15/19 with increased dyspnea and edema. ECHO completed and showed reduced LV EF <20%. Had cath to further evaluate and this showed normal coronaries, severe NICM, and normal cardiac output. Diuresed with IV lasix and HF meds.   On 11/25/19 she had doppler of RUE due to pain. This showed R radial occlusion.   12/25/19 Home Sleep Study completed- Moderate sleep apnea. Starting CPAP  Today she returns for HF follow up. Last visit bidil and spiro started. Overall feeling fine. R wrist pain improved. Now only mild pain. SOB with inclines. Denies PND/Orthopnea. Appetite ok. No fever or chills. Weight at home 245-248 pounds. Walking 90 minutes 3 days a week. Taking all medications.  ECHO 11/15/19-->LV EF <20% RV mildly reduced   RHC 11/18/19 Ao = 87/59 (71) LV = 92/2 RA = 2 RV = 24/4 PA = 24/2 (14) PCW = 6 Fick cardiac output/index = 7.6/3.4 PVR = 1.1 WU SVR = 728 Ao sat = 95% PA sat = 64%, 64% Assessment: 1. Normal coronary arteries 2. 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 Well compensated (low) filling pressures with normal cardiac output   ROS: All systems negative except as listed in HPI, PMH and Problem List.  SH:  Social History   Socioeconomic History  . Marital status: Married    Spouse name: Not on file  . Number of children: 1  . Years of education: Not on file  . Highest education level: Not on file  Occupational History  . Not on file  Tobacco Use  . Smoking status: Never Smoker  . Smokeless tobacco: Never Used  Substance and Sexual Activity  . Alcohol use: No    Alcohol/week: 0.0 standard drinks  . Drug use: No  . Sexual activity: Not on file  Other Topics Concern  . Not on file  Social History Narrative  . Not on file   Social Determinants of Health   Financial Resource Strain:   .  Difficulty of Paying Living Expenses:   Food Insecurity:   . Worried About Charity fundraiser in the Last Year:   . Arboriculturist in the Last Year:   Transportation Needs:   . Film/video editor (Medical):   Marland Kitchen Lack of Transportation (Non-Medical):   Physical Activity:   . Days of Exercise per Week:   . Minutes of Exercise per Session:   Stress:   . Feeling of Stress :   Social Connections:   . Frequency of Communication with Friends and Family:   . Frequency of Social Gatherings with Friends and Family:   . Attends Religious Services:   . Active Member of Clubs or Organizations:   . Attends Archivist Meetings:   Marland Kitchen Marital Status:   Intimate Partner Violence:   . Fear of Current or Ex-Partner:   . Emotionally Abused:   Marland Kitchen Physically Abused:   . Sexually Abused:     FH:  Family History  Problem Relation Age of Onset  . Heart failure Father   . Diabetes Father   . Congestive Heart Failure Other     Past Medical History:  Diagnosis Date  . Anxiety   . HTN (hypertension)     Current Outpatient Medications  Medication Sig Dispense Refill  . carvedilol (COREG) 6.25  MG tablet Take 1.5 tablets (9.375 mg total) by mouth 2 (two) times daily with a meal. 270 tablet 3  . Ferrous Fumarate (IRON) 18 MG TBCR Take by mouth.    . furosemide (LASIX) 20 MG tablet TAKE 1 TABLET BY MOUTH EVERY DAY 30 tablet 3  . gabapentin (NEURONTIN) 100 MG capsule Take 100 mg by mouth 2 (two) times daily.    . isosorbide-hydrALAZINE (BIDIL) 20-37.5 MG tablet Take 1 tablet by mouth 3 (three) times daily. 90 tablet 3  . lamoTRIgine (LAMICTAL) 200 MG tablet Take 400 mg by mouth at bedtime.     Marland Kitchen LORazepam (ATIVAN) 0.5 MG tablet Take 0.5 mg by mouth daily as needed for anxiety.     . sacubitril-valsartan (ENTRESTO) 97-103 MG Take 1 tablet by mouth 2 (two) times daily. 60 tablet 11  . spironolactone (ALDACTONE) 25 MG tablet Take 1 tablet (25 mg total) by mouth daily. 30 tablet 3   No  current facility-administered medications for this encounter.    Vitals:   01/07/20 0849  BP: (!) 114/56  Pulse: 93  SpO2: 97%  Weight: 113.4 kg (250 lb)   Wt Readings from Last 3 Encounters:  01/07/20 113.4 kg (250 lb)  12/23/19 114.5 kg (252 lb 8 oz)  12/10/19 114.4 kg (252 lb 3.2 oz)    PHYSICAL EXAM: General:  Well appearing. No resp difficulty HEENT: normal Neck: supple. no JVD. Carotids 2+ bilat; no bruits. No lymphadenopathy or thryomegaly appreciated. Cor: PMI nondisplaced. Regular rate & rhythm. No rubs, gallops or murmurs. Lungs: clear Abdomen: obese, soft, nontender, nondistended. No hepatosplenomegaly. No bruits or masses. Good bowel sounds. Extremities: no cyanosis, clubbing, rash, edema Neuro: alert & orientedx3, cranial nerves grossly intact. moves all 4 extremities w/o difficulty. Affect pleasant  ASSESSMENT & PLAN: 1. Chronic Systolic HF, NICM Had LHC/RHC on 11/18/19 with normal coronaries and preserved cardiac output. ECHO 11/15/19 showed LVEF was down < 20% suspect due to HTN and OSA.  - NYHA III. Volume status stable. Continue lasix 20 mg daily  - Increase carvedilol  To 12.5 mg twice a day - Continue entresto 97-103  mg twice a day  - Continue spironolactone to 25 mg daily.  - Continue Bidil 1 tab three times a day.  - Plan to repeat ECHO after HF meds optimized.  -Check BMET today.   2. HTN -Stable. Improved.   3. OSA Moderate sleep apnea.  Starting CPAP next week.    4. Obesity  Body mass index is 39.16 kg/m.  Discussed portion control.   5. R forearm pain -Had LHC on 11/18/19 -11/25/19 Had R radial occlusion - Wrist/forearm pain resolving.    Follow up in 4 weeks with an ECHO. Switching meds to [facility].  Lisa Jester NP-C  9:05 AM

## 2020-01-07 ENCOUNTER — Ambulatory Visit (HOSPITAL_COMMUNITY)
Admission: RE | Admit: 2020-01-07 | Discharge: 2020-01-07 | Disposition: A | Discharge status: Home / Self Care | Payer: BC Managed Care – PPO | Source: Ambulatory Visit | Attending: Adult Health | Admitting: Adult Health

## 2020-01-07 ENCOUNTER — Other Ambulatory Visit: Payer: Self-pay

## 2020-01-07 VITALS — BP 118/62 | HR 93 | Wt 250.0 lb

## 2020-01-07 DIAGNOSIS — I428 Other cardiomyopathies: Secondary | ICD-10-CM | POA: Insufficient documentation

## 2020-01-07 DIAGNOSIS — Z79899 Other long term (current) drug therapy: Secondary | ICD-10-CM | POA: Diagnosis not present

## 2020-01-07 DIAGNOSIS — Z006 Encounter for examination for normal comparison and control in clinical research program: Secondary | ICD-10-CM

## 2020-01-07 DIAGNOSIS — I110 Hypertensive heart disease with heart failure: Secondary | ICD-10-CM | POA: Insufficient documentation

## 2020-01-07 DIAGNOSIS — I5022 Chronic systolic (congestive) heart failure: Secondary | ICD-10-CM

## 2020-01-07 DIAGNOSIS — G4733 Obstructive sleep apnea (adult) (pediatric): Secondary | ICD-10-CM

## 2020-01-07 DIAGNOSIS — M79631 Pain in right forearm: Secondary | ICD-10-CM | POA: Insufficient documentation

## 2020-01-07 DIAGNOSIS — Z6839 Body mass index (BMI) 39.0-39.9, adult: Secondary | ICD-10-CM | POA: Insufficient documentation

## 2020-01-07 DIAGNOSIS — F419 Anxiety disorder, unspecified: Secondary | ICD-10-CM | POA: Insufficient documentation

## 2020-01-07 DIAGNOSIS — M79601 Pain in right arm: Secondary | ICD-10-CM

## 2020-01-07 DIAGNOSIS — E669 Obesity, unspecified: Secondary | ICD-10-CM | POA: Insufficient documentation

## 2020-01-07 DIAGNOSIS — Z8249 Family history of ischemic heart disease and other diseases of the circulatory system: Secondary | ICD-10-CM | POA: Insufficient documentation

## 2020-01-07 DIAGNOSIS — I10 Essential (primary) hypertension: Secondary | ICD-10-CM

## 2020-01-07 LAB — BASIC METABOLIC PANEL
Anion gap: 8 (ref 5–15)
BUN: 16 mg/dL (ref 6–20)
CO2: 24 mmol/L (ref 22–32)
Calcium: 9 mg/dL (ref 8.9–10.3)
Chloride: 101 mmol/L (ref 98–111)
Creatinine, Ser: 1.19 mg/dL — ABNORMAL HIGH (ref 0.44–1.00)
GFR calc Af Amer: >60 mL/min (ref >60)
GFR calc non Af Amer: 54 mL/min — ABNORMAL LOW (ref >60)
Glucose, Bld: 163 mg/dL — ABNORMAL HIGH (ref 70–99)
Potassium: 3.9 mmol/L (ref 3.5–5.1)
Sodium: 133 mmol/L — ABNORMAL LOW (ref 135–145)

## 2020-01-07 MED ORDER — ISOSORB DINITRATE-HYDRALAZINE 20-37.5 MG PO TABS: 1.0000 | ORAL_TABLET | Freq: Three times a day (TID) | ORAL | 2 refills | Status: DC

## 2020-01-07 MED ORDER — SACUBITRIL-VALSARTAN 97-103 MG PO TABS: 1.0000 | ORAL_TABLET | Freq: Two times a day (BID) | ORAL | 2 refills | Status: DC

## 2020-01-07 MED ORDER — SPIRONOLACTONE 25 MG PO TABS: 25.0000 mg | ORAL_TABLET | Freq: Every day | ORAL | 2 refills | Status: DC

## 2020-01-07 MED ORDER — CARVEDILOL 12.5 MG PO TABS: 12.5000 mg | ORAL_TABLET | Freq: Two times a day (BID) | ORAL | 2 refills | Status: DC

## 2020-01-07 MED ORDER — FUROSEMIDE 20 MG PO TABS: 20.0000 mg | ORAL_TABLET | Freq: Every day | ORAL | 2 refills | Status: DC

## 2020-01-07 NOTE — Patient Instructions (Signed)
INCREASE Coreg to 12.5 mg, one tab twice daily  Labs today We will only contact you if something comes back abnormal or we need to make some changes. Otherwise no news is good news!  Your physician recommends that you schedule a follow-up appointment in: 4-5 week with Dr Gala Romney or Amy Cleg,NP and echo  Your physician has requested that you have an echocardiogram. Echocardiography is a painless test that uses sound waves to create images of your heart. It provides your doctor with information about the size and shape of your heart and how well your heart's chambers and valves are working. This procedure takes approximately one hour. There are no restrictions for this procedure.   Do the following things EVERYDAY: 1) Weigh yourself in the morning before breakfast. Write it down and keep it in a log. 2) Take your medicines as prescribed 3) Eat low salt foods--Limit salt (sodium) to 2000 mg per day.  4) Stay as active as you can everyday 5) Limit all fluids for the day to less than 2 liters  At the [facility], you and your health needs are our priority. As part of our continuing mission to provide you with exceptional heart care, we have created designated Provider Care Teams. These Care Teams include your primary Cardiologist (physician) and Advanced Practice Providers (APPs- Physician Assistants and Nurse Practitioners) who all work together to provide you with the care you need, when you need it.   You may see any of the following providers on your designated Care Team at your next follow up: Marland Kitchen Dr Arvilla Meres . Dr Marca Ancona . Tonye Becket, NP . Robbie Lis, PA . Karle Plumber, PharmD   Please be sure to bring in all your medications bottles to every appointment.

## 2020-01-07 NOTE — Research (Signed)
Alleviate Heart Failure Research Study  Spoke with patient this morning regarding Alleviate Heart Failure Research Study. I have given the patient the Alleviate ICF and answered questions. I will follow up with the patient in one week.

## 2020-01-08 ENCOUNTER — Telehealth: Payer: Self-pay | Admitting: *Deleted

## 2020-01-08 NOTE — Telephone Encounter (Signed)
Hello Heather, this lady thinks I don't know what I am talking about and says she already has her machine and she is not going into no lab for a test. She said she already got her results and she don't know why i'm calling. Call me after 8:15 in the morning please. Coralee North

## 2020-01-08 NOTE — Telephone Encounter (Signed)
-----   Message from Noralee Space, RN sent at 01/07/2020  9:13 AM EDT ----- Lisa Sellers, good morning, we saw this lady for f/u today and told her about her sleep study results, we told her you would be contacting her soon to sch cpap, thanks

## 2020-01-09 ENCOUNTER — Ambulatory Visit: Payer: BC Managed Care – PPO

## 2020-01-09 NOTE — Procedures (Signed)
   Sleep Study Report  Patient Information First Name: Lisa Sellers  ID: [id number] Birth Date: 06-19-1971  Age: 49  Gender: Female BMI: 39.8 (W=253 lb, H=5' 7'') Study Date:12/25/2019 Referring Physician:  Arvilla Meres, MD  TEST DESCRIPTION: Home sleep apnea testing was completed using the WatchPat, a Type 1 device, utilizing peripheral arterial tonometry (PAT), chest movement, actigraphy, pulse oximetry, pulse rate, body position and snore.AHI was calculated with apnea and hypopnea using valid sleep time as the denominator. RDI includes apneas,hypopneas, and RERAs. The data acquired and the scoring of sleep and all associated events were performed in accordance with the recommended standards and specifications as outlined in the AASM Manual for the Scoring of Sleep and Associated Events 2.2.0 (2015).  FINDINGS: 1. Moderate Obstructive Sleep Apnea with AHI 27.8/hr. 2. Minimal Central Sleep Apnea with pAHIc 2.4/hr. 3. Oxygen desaturations as low as 75%. 4. Severe snoring was present. O2 sats were < 88% for 14 minutes. 5. Total sleep time was 8hrs and . 6. 25% of total sleep time was spent in the supine position. 7. Prolonged sleep onset latency at . 8. Shortened REM sleep onset latency at 43 min. 9. Total awakenings were 7.  DIAGNOSIS: Moderate Obstructive Sleep Apnea (G47.33)  RECOMMENDATIONS  1. Clinical correlation of these findings is necessary. The decision to treat obstructive sleep apnea (OSA) is usually based on the presence of apnea symptoms or the presence of associated medical conditions such as Hypertension, Congestive Heart Failure, Atrial Fibrillation or Obesity. The most common symptoms of OSA are snoring, gasping for breath while sleeping, daytime sleepiness and fatigue.   2. Initiating apnea therapy is recommended given the presence of symptoms and/or associated conditions. Recommend proceeding with one of the following:   a. Auto-CPAP therapy  with a pressure range of 5-20cm H2O.    b. An oral appliance (OA) that can be obtained from certain dentists with expertise in sleep medicine. These are primarily of use in non-obese patients with mild and moderate disease.    c. An ENT consultation which may be useful to look for specific causes of obstruction and possible treatment options.   d. If patient is intolerant to PAP therapy, consider referral to ENT for evaluation for hypoglossal nerve stimulator.  3. Close follow-up is necessary to ensure success with CPAP or oral appliance therapy for maximum benefit .  4. A follow-up oximetry study on CPAP is recommended to assess the adequacy of therapy and determine the need for supplemental oxygen or the potential need for Bi-level therapy. An arterial blood gas to determine the adequacy of baseline ventilation and oxygenation should also be considered.  5. Healthy sleep recommendations include: adequate nightly sleep (normal 7-9 hrs/night), avoidance of caffeine afternoon and alcohol near bedtime, and maintaining a sleep environment that is cool, dark and quiet.  6. Weight loss for overweight patients is recommended. Even modest amounts of weight loss can significantly improve the severity of sleep apnea.  7. Snoring recommendations include: weight loss where appropriate, side sleeping, and avoidance of alcohol before bed.  8. Operation of motor vehicle or dangerous equipment must be avoided when feeling drowsy, excessively sleepy, or mentally fatigued.  Report prepared by: Signature: Armanda Magic, MD Donneta Romberg  Electronically Signed: Jan 09, 2020

## 2020-01-10 ENCOUNTER — Ambulatory Visit: Payer: BLUE CROSS/BLUE SHIELD | Attending: Internal Medicine

## 2020-01-10 ENCOUNTER — Telehealth (HOSPITAL_COMMUNITY): Payer: Self-pay | Admitting: Vascular Surgery

## 2020-01-10 ENCOUNTER — Other Ambulatory Visit (HOSPITAL_COMMUNITY): Payer: Self-pay | Admitting: *Deleted

## 2020-01-10 DIAGNOSIS — Z23 Encounter for immunization: Secondary | ICD-10-CM

## 2020-01-10 MED ORDER — ISOSORB DINITRATE-HYDRALAZINE 20-37.5 MG PO TABS: 1.0000 | ORAL_TABLET | Freq: Three times a day (TID) | ORAL | 2 refills | Status: DC

## 2020-01-10 MED ORDER — CARVEDILOL 12.5 MG PO TABS: 12.5000 mg | ORAL_TABLET | Freq: Two times a day (BID) | ORAL | 2 refills | Status: DC

## 2020-01-10 MED ORDER — SPIRONOLACTONE 25 MG PO TABS: 25.0000 mg | ORAL_TABLET | Freq: Every day | ORAL | 2 refills | Status: DC

## 2020-01-10 MED ORDER — SACUBITRIL-VALSARTAN 97-103 MG PO TABS: 1.0000 | ORAL_TABLET | Freq: Two times a day (BID) | ORAL | 2 refills | Status: DC

## 2020-01-10 MED ORDER — FUROSEMIDE 20 MG PO TABS: 20.0000 mg | ORAL_TABLET | Freq: Every day | ORAL | 2 refills | Status: DC

## 2020-01-10 NOTE — Progress Notes (Signed)
   Covid-19 Vaccination Clinic  Name:  Lisa Sellers    MRN: [id number] DOB: 07-22-1971  01/10/2020  [name] was observed post Covid-19 immunization for 30 minutes based on pre-vaccination screening without incident. She was provided with Vaccine Information Sheet and instruction to access the V-Safe system.   [name] was instructed to call 911 with any severe reactions post vaccine: Marland Kitchen Difficulty breathing  . Swelling of face and throat  . A fast heartbeat  . A bad rash all over body  . Dizziness and weakness   Immunizations Administered    Name Date Dose VIS Date Route   Pfizer COVID-19 Vaccine 01/10/2020 10:22 AM 0.3 mL 09/27/2019 Intramuscular   Manufacturer: [employer], [employer]   Lot: [id number]   NDC: [address]-3

## 2020-01-10 NOTE — Telephone Encounter (Signed)
Needs all hf meds sent [facility]

## 2020-01-16 ENCOUNTER — Telehealth: Payer: Self-pay | Admitting: *Deleted

## 2020-01-16 NOTE — Telephone Encounter (Signed)
Appointment scheduled for 02/14/20 at 9:20.

## 2020-01-16 NOTE — Telephone Encounter (Signed)
Patient says she already has her machine and she is not going into no lab for a test. She said she already got her results and she don't know why i'm calling.

## 2020-01-16 NOTE — Telephone Encounter (Signed)
Quintella Reichert, MD      I think she was ordered through [facility] - please set up appt with me in 8 weeks   Reesa Chew, [facility]  01/16/2020 4:41 PM EDT    Patient says she already has her machine and she is not going into no lab for a test. She said she already got her results and she don't know why i'm calling.   Quintella Reichert, MD  01/03/2020 2:03 PM EDT    Home sleep study showed moderate OSA with nocturnal hypoxemia - please set up in lab CPAP study

## 2020-01-16 NOTE — Telephone Encounter (Signed)
  Quintella Reichert, MD  Reesa Chew, CMA  Home sleep study showed moderate OSA with nocturnal hypoxemia - please set up in lab CPAP study

## 2020-01-16 NOTE — Telephone Encounter (Signed)
YOUR CARDIOLOGY TEAM HAS ARRANGED FOR AN E-VISIT FOR YOUR APPOINTMENT - PLEASE REVIEW IMPORTANT INFORMATION BELOW SEVERAL DAYS PRIOR TO YOUR APPOINTMENT  Due to the recent COVID-19 pandemic, we are transitioning in-person office visits to tele-medicine visits in an effort to decrease unnecessary exposure to our patients, their families, and staff. These visits are billed to your insurance just like a normal visit is. We also encourage you to sign up for MyChart if you have not already done so. You will need a smartphone if possible. For patients that do not have this, we can still complete the visit using a regular telephone but do prefer a smartphone to enable video when possible. You may have a family member that lives with you that can help. If possible, we also ask that you have a blood pressure cuff and scale at home to measure your blood pressure, heart rate and weight prior to your scheduled appointment. Patients with clinical needs that need an in-person evaluation and testing will still be able to come to the office if absolutely necessary. If you have any questions, feel free to call our office.     YOUR PROVIDER WILL BE USING THE FOLLOWING PLATFORM TO COMPLETE YOUR VISIT:   . IF USING MYCHART - How to Download the MyChart App to Your SmartPhone   - If Apple, go to App Store and type in MyChart in the search bar and download the app. If Android, ask patient to go to Google Play Store and type in MyChart in the search bar and download the app. The app is free but as with any other app downloads, your phone may require you to verify saved payment information or Apple/Android password.  - You will need to then log into the app with your MyChart username and password, and select [facility] as your healthcare provider to link the account.  - When it is time for your visit, go to the MyChart app, find appointments, and click Begin Video Visit. Be sure to Select Allow for your device to access the  Microphone and Camera for your visit. You will then be connected, and your provider will be with you shortly.  **If you have any issues connecting or need assistance, please contact MyChart service desk [phone] ([phone])**  **If using a computer, in order to ensure the best quality for your visit, you will need to use either of the following Internet Browsers: Google Chrome or Microsoft Edge**  . IF USING DOXIMITY or DOXY.ME - The staff will give you instructions on receiving your link to join the meeting the day of your visit.      THE DAY OF YOUR APPOINTMENT  Approximately 15 minutes prior to your scheduled appointment, you will receive a telephone call from one of [facility] team - your caller ID may say "Unknown caller."  Our staff will confirm medications, vital signs for the day and any symptoms you may be experiencing. Please have this information available prior to the time of visit start. It may also be helpful for you to have a pad of paper and pen handy for any instructions given during your visit. They will also walk you through joining the smartphone meeting if this is a video visit.    CONSENT FOR TELE-HEALTH VISIT - PLEASE REVIEW  I hereby voluntarily request, consent and authorize [facility] and its employed or contracted physicians, physician assistants, nurse practitioners or other licensed health care professionals (the Practitioner), to provide me with telemedicine health care   services (the "Services") as deemed necessary by the treating Practitioner. I acknowledge and consent to receive the Services by the Practitioner via telemedicine. I understand that the telemedicine visit will involve communicating with the Practitioner through live audiovisual communication technology and the disclosure of certain medical information by electronic transmission. I acknowledge that I have been given the opportunity to request an in-person assessment or other available  alternative prior to the telemedicine visit and am voluntarily participating in the telemedicine visit.  I understand that I have the right to withhold or withdraw my consent to the use of telemedicine in the course of my care at any time, without affecting my right to future care or treatment, and that the Practitioner or I may terminate the telemedicine visit at any time. I understand that I have the right to inspect all information obtained and/or recorded in the course of the telemedicine visit and may receive copies of available information for a reasonable fee.  I understand that some of the potential risks of receiving the Services via telemedicine include:  . Delay or interruption in medical evaluation due to technological equipment failure or disruption; . Information transmitted may not be sufficient (e.g. poor resolution of images) to allow for appropriate medical decision making by the Practitioner; and/or  . In rare instances, security protocols could fail, causing a breach of personal health information.  Furthermore, I acknowledge that it is my responsibility to provide information about my medical history, conditions and care that is complete and accurate to the best of my ability. I acknowledge that Practitioner's advice, recommendations, and/or decision may be based on factors not within their control, such as incomplete or inaccurate data provided by me or distortions of diagnostic images or specimens that may result from electronic transmissions. I understand that the practice of medicine is not an exact science and that Practitioner makes no warranties or guarantees regarding treatment outcomes. I acknowledge that I will receive a copy of this consent concurrently upon execution via email to the email address I last provided but may also request a printed copy by calling the office of [facility].    I understand that my insurance will be billed for this visit.   I have read or had  this consent read to me. . I understand the contents of this consent, which adequately explains the benefits and risks of the Services being provided via telemedicine.  . I have been provided ample opportunity to ask questions regarding this consent and the Services and have had my questions answered to my satisfaction. . I give my informed consent for the services to be provided through the use of telemedicine in my medical care  By participating in this telemedicine visit I agree to the above. 

## 2020-01-24 NOTE — Telephone Encounter (Addendum)
Lisa Reichert, MD      I think she was ordered through [facility] - please set up appt with me in 8 weeks

## 2020-02-04 ENCOUNTER — Ambulatory Visit: Payer: BC Managed Care – PPO | Attending: Internal Medicine

## 2020-02-04 ENCOUNTER — Other Ambulatory Visit (HOSPITAL_COMMUNITY): Payer: BLUE CROSS/BLUE SHIELD

## 2020-02-04 ENCOUNTER — Encounter (HOSPITAL_COMMUNITY): Payer: BLUE CROSS/BLUE SHIELD

## 2020-02-04 DIAGNOSIS — Z23 Encounter for immunization: Secondary | ICD-10-CM

## 2020-02-04 NOTE — Progress Notes (Signed)
   Covid-19 Vaccination Clinic  Name:  Salina Stanfield    MRN: [id number] DOB: 05/03/1971  02/04/2020  Ms. Deitrick was observed post Covid-19 immunization for 15 minutes without incident. She was provided with Vaccine Information Sheet and instruction to access the V-Safe system.   Ms. Uriarte was instructed to call 911 with any severe reactions post vaccine: Marland Kitchen Difficulty breathing  . Swelling of face and throat  . A fast heartbeat  . A bad rash all over body  . Dizziness and weakness   Immunizations Administered    Name Date Dose VIS Date Route   Pfizer COVID-19 Vaccine 02/04/2020  9:54 AM 0.3 mL 12/11/2018 Intramuscular   Manufacturer: [employer], [employer]   Lot: [id number]   NDC: [address]-5

## 2020-02-06 ENCOUNTER — Other Ambulatory Visit: Payer: Self-pay

## 2020-02-06 ENCOUNTER — Ambulatory Visit (HOSPITAL_COMMUNITY)
Admission: RE | Admit: 2020-02-06 | Discharge: 2020-02-06 | Disposition: A | Discharge status: Home / Self Care | Payer: BC Managed Care – PPO | Source: Ambulatory Visit | Attending: Adult Health | Admitting: Adult Health

## 2020-02-06 ENCOUNTER — Ambulatory Visit (HOSPITAL_BASED_OUTPATIENT_CLINIC_OR_DEPARTMENT_OTHER)
Admission: RE | Admit: 2020-02-06 | Discharge: 2020-02-06 | Disposition: A | Discharge status: Home / Self Care | Payer: BC Managed Care – PPO | Source: Ambulatory Visit | Attending: Adult Health | Admitting: Adult Health

## 2020-02-06 ENCOUNTER — Encounter (HOSPITAL_COMMUNITY): Payer: Self-pay

## 2020-02-06 VITALS — BP 124/76 | HR 84 | Wt 256.4 lb

## 2020-02-06 DIAGNOSIS — M79601 Pain in right arm: Secondary | ICD-10-CM

## 2020-02-06 DIAGNOSIS — Z6841 Body Mass Index (BMI) 40.0 and over, adult: Secondary | ICD-10-CM | POA: Insufficient documentation

## 2020-02-06 DIAGNOSIS — I5022 Chronic systolic (congestive) heart failure: Secondary | ICD-10-CM | POA: Insufficient documentation

## 2020-02-06 DIAGNOSIS — I428 Other cardiomyopathies: Secondary | ICD-10-CM | POA: Diagnosis not present

## 2020-02-06 DIAGNOSIS — E669 Obesity, unspecified: Secondary | ICD-10-CM | POA: Insufficient documentation

## 2020-02-06 DIAGNOSIS — I10 Essential (primary) hypertension: Secondary | ICD-10-CM | POA: Diagnosis not present

## 2020-02-06 DIAGNOSIS — M79631 Pain in right forearm: Secondary | ICD-10-CM | POA: Diagnosis not present

## 2020-02-06 DIAGNOSIS — Z79899 Other long term (current) drug therapy: Secondary | ICD-10-CM | POA: Insufficient documentation

## 2020-02-06 DIAGNOSIS — G4733 Obstructive sleep apnea (adult) (pediatric): Secondary | ICD-10-CM | POA: Insufficient documentation

## 2020-02-06 DIAGNOSIS — I110 Hypertensive heart disease with heart failure: Secondary | ICD-10-CM | POA: Diagnosis not present

## 2020-02-06 DIAGNOSIS — F419 Anxiety disorder, unspecified: Secondary | ICD-10-CM | POA: Diagnosis not present

## 2020-02-06 DIAGNOSIS — Z8249 Family history of ischemic heart disease and other diseases of the circulatory system: Secondary | ICD-10-CM | POA: Insufficient documentation

## 2020-02-06 NOTE — Patient Instructions (Signed)
No medication changes today!  You have been referred to the PREP program at the [facility]. You will get a call to discuss this in detail.  Your physician recommends that you schedule a follow-up appointment in: 3 months with Dr Gala Romney  Thursday July 22nd, 2021 at 10:20am GARAGE Code 4009  Please call office at [phone] option 2 if you have any questions or concerns.   At the [facility], you and your health needs are our priority. As part of our continuing mission to provide you with exceptional heart care, we have created designated Provider Care Teams. These Care Teams include your primary Cardiologist (physician) and Advanced Practice Providers (APPs- Physician Assistants and Nurse Practitioners) who all work together to provide you with the care you need, when you need it.   You may see any of the following providers on your designated Care Team at your next follow up: Marland Kitchen Dr Arvilla Meres . Dr Marca Ancona . Tonye Becket, NP . Robbie Lis, PA . Karle Plumber, PharmD   Please be sure to bring in all your medications bottles to every appointment.

## 2020-02-06 NOTE — Progress Notes (Signed)
PCP: Dr Truman Hayward Primary Cardiologist: Dr Haroldine Laws   HPI: Lisa Sellers is a 49 year old with a history of HTN, bipolar, and recently diagnosed NICM with reduced EF < 20% in 2021.   Admitted to [facility] on 11/15/19 with increased dyspnea and edema. ECHO completed and showed reduced LV EF <20%. Had cath to further evaluate and this showed normal coronaries, severe NICM, and normal cardiac output. Diuresed with IV lasix and HF meds.   On 11/25/19 she had doppler of RUE due to pain. This showed R radial occlusion.   12/25/19 Home Sleep Study completed- Moderate sleep apnea. Starting CPAP.  Today she returns for HF follow up.Overall feeling fine. Denies SOB/PND/Orthopnea. Walking 3 days a week 1 hour at a time. Appetite ok. No fever or chills. Weight at home has been stable.  Using CPAP every night.  Taking all medications.   ECHO 02/06/20 -->55-60% Grade IDD ECHO 11/15/19-->LV EF <20% RV mildly reduced   RHC 11/18/19 Ao = 87/59 (71) LV = 92/2 RA = 2 RV = 24/4 PA = 24/2 (14) PCW = 6 Fick cardiac output/index = 7.6/3.4 PVR = 1.1 WU SVR = 728 Ao sat = 95% PA sat = 64%, 64% Assessment: 1. Normal coronary arteries 2. 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 Well compensated (low) filling pressures with normal cardiac output   ROS: All systems negative except as listed in HPI, PMH and Problem List.  SH:  Social History   Socioeconomic History  . Marital status: Married    Spouse name: Not on file  . Number of children: 1  . Years of education: Not on file  . Highest education level: Not on file  Occupational History  . Not on file  Tobacco Use  . Smoking status: Never Smoker  . Smokeless tobacco: Never Used  Substance and Sexual Activity  . Alcohol use: No    Alcohol/week: 0.0 standard drinks  . Drug use: No  . Sexual activity: Not on file  Other Topics Concern  . Not on file  Social History Narrative  . Not on file   Social Determinants of Health   Financial Resource Strain:   . Difficulty of  Paying Living Expenses:   Food Insecurity: No Food Insecurity  . Worried About Charity fundraiser in the Last Year: Never true  . Ran Out of Food in the Last Year: Never true  Transportation Needs: No Transportation Needs  . Lack of Transportation (Medical): No  . Lack of Transportation (Non-Medical): No  Physical Activity: Sufficiently Active  . Days of Exercise per Week: 7 days  . Minutes of Exercise per Session: 60 min  Stress:   . Feeling of Stress :   Social Connections:   . Frequency of Communication with Friends and Family:   . Frequency of Social Gatherings with Friends and Family:   . Attends Religious Services:   . Active Member of Clubs or Organizations:   . Attends Archivist Meetings:   Marland Kitchen Marital Status:   Intimate Partner Violence:   . Fear of Current or Ex-Partner:   . Emotionally Abused:   Marland Kitchen Physically Abused:   . Sexually Abused:     FH:  Family History  Problem Relation Age of Onset  . Heart failure Father   . Diabetes Father   . Congestive Heart Failure Other     Past Medical History:  Diagnosis Date  . Anxiety   . HTN (hypertension)     Current Outpatient Medications  Medication Sig Dispense Refill  . carvedilol (COREG) 12.5 MG tablet Take 1 tablet (12.5 mg total) by mouth 2 (two) times daily with a meal. 180 tablet 2  . Ferrous Fumarate (IRON) 18 MG TBCR Take by mouth.    . furosemide (LASIX) 20 MG tablet Take 1 tablet (20 mg total) by mouth daily. 90 tablet 2  . gabapentin (NEURONTIN) 100 MG capsule Take 100 mg by mouth 2 (two) times daily.    . isosorbide-hydrALAZINE (BIDIL) 20-37.5 MG tablet Take 1 tablet by mouth 3 (three) times daily. 270 tablet 2  . lamoTRIgine (LAMICTAL) 200 MG tablet Take 400 mg by mouth at bedtime.     Marland Kitchen LORazepam (ATIVAN) 0.5 MG tablet Take 0.5 mg by mouth daily as needed for anxiety.     . sacubitril-valsartan (ENTRESTO) 97-103 MG Take 1 tablet by mouth 2 (two) times daily. 180 tablet 2  . spironolactone  (ALDACTONE) 25 MG tablet Take 1 tablet (25 mg total) by mouth daily. 90 tablet 2   No current facility-administered medications for this encounter.    Vitals:   02/06/20 0853  BP: 124/76  Pulse: 84  SpO2: 96%  Weight: 116.3 kg (256 lb 6.4 oz)   Wt Readings from Last 3 Encounters:  02/06/20 116.3 kg (256 lb 6.4 oz)  01/07/20 113.4 kg (250 lb)  12/23/19 114.5 kg (252 lb 8 oz)    PHYSICAL EXAM: General:  Well appearing. No resp difficulty HEENT: normal Neck: supple. no JVD. Carotids 2+ bilat; no bruits. No lymphadenopathy or thryomegaly appreciated. Cor: PMI nondisplaced. Regular rate & rhythm. No rubs, gallops or murmurs. Lungs: clear Abdomen: obese, soft, nontender, nondistended. No hepatosplenomegaly. No bruits or masses. Good bowel sounds. Extremities: no cyanosis, clubbing, rash, trace lower extremity edema Neuro: alert & orientedx3, cranial nerves grossly intact. moves all 4 extremities w/o difficulty. Affect pleasant  ASSESSMENT & PLAN: 1. Chronic Systolic HF, NICM Had LHC/RHC on 11/18/19 with normal coronaries and preserved cardiac output. ECHO 11/15/19 showed LVEF was down < 20% suspect due to HTN and OSA.  - Todays ECHO was reviewed Dr Gala Romney. EF recovered 55-60% + Grade 1 DD !!! - NYHA II-III. Functional improvement. Continue lasix 20 mg daily  -Contine carvedilol12.5 mg twice a day - Continue entresto 97-103  mg twice a day  - Continue spironolactone to 25 mg daily.  - Continue Bidil 1 tab three times a day.  -  2. HTN Stable.   3. OSA Moderate sleep apnea.  Continue CPAP every night.   4. Obesity  Body mass index is 40.16 kg/m.  Discussed portion control.  - Refer to [facility] Prep Program  5. R forearm pain -Had LHC on 11/18/19 -11/25/19 Had R radial occlusion - Wrist/forearm pain much improved.    Discussed ECHO results. Continue current regimen. Follow up in 3 months.   Greater than 50% of the (total minutes 25 ) visit spent in counseling/coordination of  care regarding the above.     Azariel Banik NP-C  9:24 AM

## 2020-02-06 NOTE — Progress Notes (Signed)
  Echocardiogram 2D Echocardiogram has been performed.  Lisa Sellers 02/06/2020, 8:52 AM

## 2020-02-07 ENCOUNTER — Telehealth: Payer: Self-pay

## 2020-02-07 NOTE — Telephone Encounter (Signed)
Call placed to patient reference referral to PREP. Explained PREP program including nutrition education/health education, exercise program to patient.  Patient states she has a Photographer. Offered next class start end of May T/Thur 1-215. Patient sts she has exercise classes sched for then.  Offered to recall patient closer to start date to check in on how her progress was going as PREP would offer her help with guiding her nutrition for changes and managing her CHF. Patient agreeable for recall.

## 2020-02-14 ENCOUNTER — Encounter: Payer: Self-pay | Admitting: Cardiology

## 2020-02-14 ENCOUNTER — Telehealth (INDEPENDENT_AMBULATORY_CARE_PROVIDER_SITE_OTHER): Payer: BC Managed Care – PPO | Admitting: Cardiology

## 2020-02-14 VITALS — Ht 67.0 in | Wt 249.0 lb

## 2020-02-14 DIAGNOSIS — I10 Essential (primary) hypertension: Secondary | ICD-10-CM

## 2020-02-14 DIAGNOSIS — G4733 Obstructive sleep apnea (adult) (pediatric): Secondary | ICD-10-CM

## 2020-02-14 DIAGNOSIS — E669 Obesity, unspecified: Secondary | ICD-10-CM | POA: Diagnosis not present

## 2020-02-14 NOTE — Patient Instructions (Signed)
Medication Instructions:  Your physician recommends that you continue on your current medications as directed. Please refer to the Current Medication list given to you today.  *If you need a refill on your cardiac medications before your next appointment, please call your pharmacy*   Lab Work: none If you have labs (blood work) drawn today and your tests are completely normal, you will receive your results only by: . MyChart Message (if you have MyChart) OR . A paper copy in the mail If you have any lab test that is abnormal or we need to change your treatment, we will call you to review the results.   Testing/Procedures: none   Follow-Up: At [facility], you and your health needs are our priority.  As part of our continuing mission to provide you with exceptional heart care, we have created designated Provider Care Teams.  These Care Teams include your primary Cardiologist (physician) and Advanced Practice Providers (APPs -  Physician Assistants and Nurse Practitioners) who all work together to provide you with the care you need, when you need it.  We recommend signing up for the patient portal called "MyChart".  Sign up information is provided on this After Visit Summary.  MyChart is used to connect with patients for Virtual Visits (Telemedicine).  Patients are able to view lab/test results, encounter notes, upcoming appointments, etc.  Non-urgent messages can be sent to your provider as well.   To learn more about what you can do with MyChart, go to [email or web address].    Your next appointment:   1 year(s)  The format for your next appointment:   Virtual Visit   Provider:   Traci Turner, MD   Other Instructions   

## 2020-02-14 NOTE — Progress Notes (Signed)
Virtual Visit via Telephone Note   This visit type was conducted due to national recommendations for restrictions regarding the COVID-19 Pandemic (e.g. social distancing) in an effort to limit this patient's exposure and mitigate transmission in our community.  Due to her co-morbid illnesses, this patient is at least at moderate risk for complications without adequate follow up.  This format is felt to be most appropriate for this patient at this time.  The patient did not have access to video technology/had technical difficulties with video requiring transitioning to audio format only (telephone).  All issues noted in this document were discussed and addressed.  No physical exam could be performed with this format.  Please refer to the patient's chart for her  consent to telehealth for [facility].  Evaluation Performed:  Follow-up visit  This visit type was conducted due to national recommendations for restrictions regarding the COVID-19 Pandemic (e.g. social distancing).  This format is felt to be most appropriate for this patient at this time.  All issues noted in this document were discussed and addressed.  No physical exam was performed (except for noted visual exam findings with Video Visits).  Please refer to the patient's chart (MyChart message for video visits and phone note for telephone visits) for the patient's consent to telehealth for [facility].  Date:  02/14/2020   ID:  Lisa Sellers, DOB [birth year], MRN [id number]  Patient Location:  Home  Provider location:   [place]  PCP:  Franki Cabot, MD  Cardiologist:  Glori Bickers, MD Sleep Medicine:  Fransico Him, MD Electrophysiologist:  None   Chief Complaint:  OSA  History of Present Illness:    Lisa Sellers is a 49 y.o. female who presents via audio/video conferencing for a telehealth visit today.    This is a 49yo female with a hx of chronic systolic CHF, NICM, HTN who was referred for  sleep study.  She tells me that she would have some mild sleepiness during the day.  She also says that her husband would say that she was snoring and having apneas during sleep.  Home sleep study revealed moderate OSA with an AHI of 28/hr and she was started on auto CPAP.  She is doing well with her CPAP device and thinks that she has gotten used to it.  She first tried the nasal mask and then switched to the full face mask and went back to the nasal pillow mask which she tolerates well.  She feels the pressure is adequate.  Since going on CPAP she feels rested in the am and has no significant daytime sleepiness.  She denies any significant mouth or nasal dryness or nasal congestion.  She does not think that he snores.    The patient does not have symptoms concerning for COVID-19 infection (fever, chills, cough, or new shortness of breath).   Prior CV studies:   The following studies were reviewed today:  Home sleep study and PAP compliance download  Past Medical History:  Diagnosis Date  . Anxiety   . HTN (hypertension)    Past Surgical History:  Procedure Laterality Date  . CHOLECYSTECTOMY    . FRACTURE SURGERY    . OOPHORECTOMY Right   . OOPHORECTOMY    . RIGHT/LEFT HEART CATH AND CORONARY ANGIOGRAPHY N/A 11/18/2019   Procedure: RIGHT/LEFT HEART CATH AND CORONARY ANGIOGRAPHY;  Surgeon: Jolaine Artist, MD;  Location: [place] [facility];  Service: Cardiovascular;  Laterality: N/A;     Current  Meds  Medication Sig  . carvedilol (COREG) 12.5 MG tablet Take 1 tablet (12.5 mg total) by mouth 2 (two) times daily with a meal.  . Ferrous Fumarate (IRON) 18 MG TBCR Take by mouth daily.   . furosemide (LASIX) 20 MG tablet Take 1 tablet (20 mg total) by mouth daily.  Marland Kitchen gabapentin (NEURONTIN) 100 MG capsule Take 100 mg by mouth 3 (three) times daily.   . isosorbide-hydrALAZINE (BIDIL) 20-37.5 MG tablet Take 1 tablet by mouth 3 (three) times daily.  Marland Kitchen lamoTRIgine (LAMICTAL) 200 MG tablet Take  400 mg by mouth at bedtime.   Marland Kitchen LORazepam (ATIVAN) 0.5 MG tablet Take 0.5 mg by mouth daily as needed for anxiety.   . sacubitril-valsartan (ENTRESTO) 97-103 MG Take 1 tablet by mouth 2 (two) times daily.  Marland Kitchen spironolactone (ALDACTONE) 25 MG tablet Take 1 tablet (25 mg total) by mouth daily.     Allergies:   Penicillins   Social History   Tobacco Use  . Smoking status: Never Smoker  . Smokeless tobacco: Never Used  Substance Use Topics  . Alcohol use: No    Alcohol/week: 0.0 standard drinks  . Drug use: No     Family Hx: The patient's family history includes Congestive Heart Failure in an other family member; Diabetes in her father; Heart failure in her father.  ROS:   Please see the history of present illness.     All other systems reviewed and are negative.   Labs/Other Tests and Data Reviewed:    Recent Labs: 11/15/2019: TSH 3.123 11/17/2019: Platelets 346 11/18/2019: Hemoglobin 10.9 11/26/2019: B Natriuretic Peptide 84.0 01/07/2020: BUN 16; Creatinine, Ser 1.19; Potassium 3.9; Sodium 133   Recent Lipid Panel No results found for: CHOL, TRIG, HDL, CHOLHDL, LDLCALC, LDLDIRECT  Wt Readings from Last 3 Encounters:  02/14/20 249 lb (112.9 kg)  02/06/20 256 lb 6.4 oz (116.3 kg)  01/07/20 250 lb (113.4 kg)     Objective:    Vital Signs:  Ht 5\' 7"  (1.702 m)   Wt 249 lb (112.9 kg)   BMI 39.00 kg/m    CONSTITUTIONAL:  Well nourished, well developed female in no acute distress.  EYES: anicteric MOUTH: oral mucosa is pink RESPIRATORY: Normal respiratory effort, symmetric expansion CARDIOVASCULAR: No peripheral edema SKIN: No rash, lesions or ulcers MUSCULOSKELETAL: no digital cyanosis NEURO: Cranial Nerves II-XII grossly intact, moves all extremities PSYCH: Intact judgement and insight.  A&O x 3, Mood/affect appropriate   ASSESSMENT & PLAN:    1.  OSA - The pathophysiology of obstructive sleep apnea , it's cardiovascular consequences & modes of treatment including  CPAP were discused with the patient in detail & they evidenced understanding.  The patient is tolerating PAP therapy well without any problems. The PAP download was reviewed today and showed an AHI of 3.9/hr on AUTO PAP cm H2O with 53% compliance in using more than 4 hours nightly.  The patient has been using and benefiting from PAP use and will continue to benefit from therapy.   2.  HTN -continue Entresto 97-103mg  BID, Bidil 20-37.5mg  TID, spiro 25mg  daily and Carvedilol 12.5mg  BID.   3.  Obesity -I have encouraged her to get into a routine exercise program and cut back on carbs and portions.   COVID-19 Education: The signs and symptoms of COVID-19 were discussed with the patient and how to seek care for testing (follow up with PCP or arrange E-visit).  The importance of social distancing was discussed today.  Patient Risk:  After full review of this patient's clinical status, I feel that they are at least moderate risk at this time.  Time:   Today, I have spent 20 minutes on telemedicine discussing medical problems including OSA, HTN, obesity and reviewing patient's chart including PAP compliance download.  Medication Adjustments/Labs and Tests Ordered: Current medicines are reviewed at length with the patient today.  Concerns regarding medicines are outlined above.  Tests Ordered: No orders of the defined types were placed in this encounter.  Medication Changes: No orders of the defined types were placed in this encounter.   Disposition:  Follow up in 1 year(s)  Signed, Armanda Magic, MD  02/14/2020 9:12 AM    [facility]

## 2020-02-21 ENCOUNTER — Encounter (HOSPITAL_COMMUNITY): Payer: Self-pay | Admitting: *Deleted

## 2020-02-21 NOTE — Progress Notes (Signed)
Pt's EF recovered and she returned to work on 5/3, [name] Dis[employer] forms completed and updated w/RTW date of 02/17/20, forms along with last OV and echo report faxed to [place] [facility] at [phone]. Pt is aware this has been done

## 2020-03-10 ENCOUNTER — Telehealth: Payer: Self-pay

## 2020-03-10 NOTE — Telephone Encounter (Signed)
Reached back out to patient regarding PREP referral and class starting 03/24/20- T/TH 1-215pm x 12 wks.  Patient sts just signed up for classes at that same time as PREP would be scheduled. Things are going well and her heart is getting stronger.  Encouraged her to reach back to me if things change and she wanted to participate in PREP. She said she has my number to call.

## 2020-03-10 NOTE — Telephone Encounter (Signed)
Reached out to patient reference PREP referral and class starting 03/24/20 T/TH 1p-215pm x 12 wks Patient sts she has just signed up for classes at the same time.  Reports things are going well and heart is getting stronger.  Encouraged to call if situation changes and she would like to be in another PREP class. Pt reports she has my number.

## 2020-03-18 IMAGING — CT CT ANGIO CHEST
3 of 7 series · 18 of 36 positions shown · IV contrast (omnipaque)
Comparison: Single-view of the chest 11/03/2019. PA and lateral
chest today.

CLINICAL DATA: Onset shortness of breath yesterday. Oxygen
desaturation. The patient reports she was discharged from the
hospital 11/08/2019 after admission for pneumonia.

EXAM:
CT ANGIOGRAPHY CHEST WITH CONTRAST
TECHNIQUE: Multidetector CT imaging of the chest was performed using the
standard protocol during bolus administration of intravenous
contrast. Multiplanar CT image reconstructions and MIPs were
obtained to evaluate the vascular anatomy.
CONTRAST:  100 mL OMNIPAQUE IOHEXOL 350 MG/ML SOLN

[Series 4: lung · axial · 0.70mm/px · z∈[+1553,+1697]mm · 4 of 122 slices shown]
[im 25/122  mediastinal]
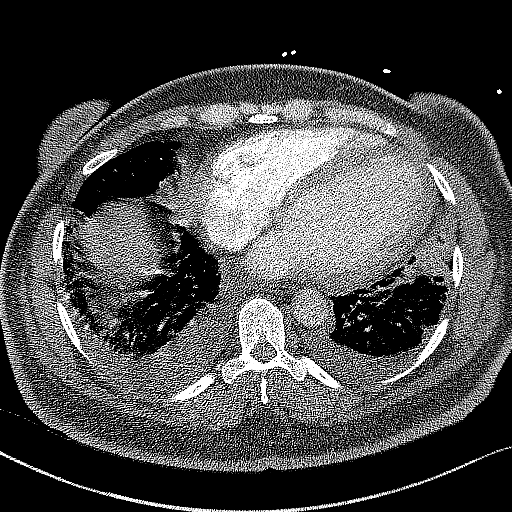
[im 49/122  mediastinal]
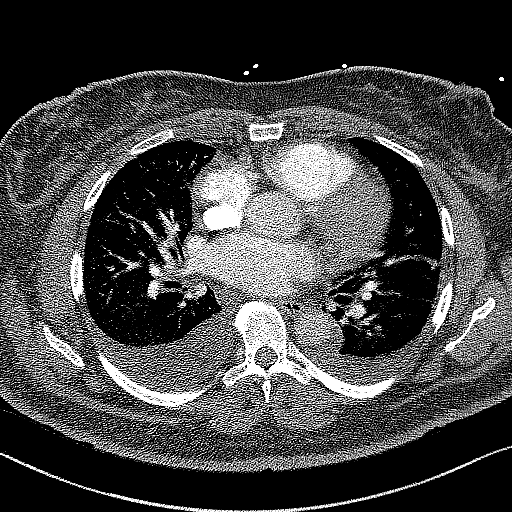
[im 73/122  mediastinal]
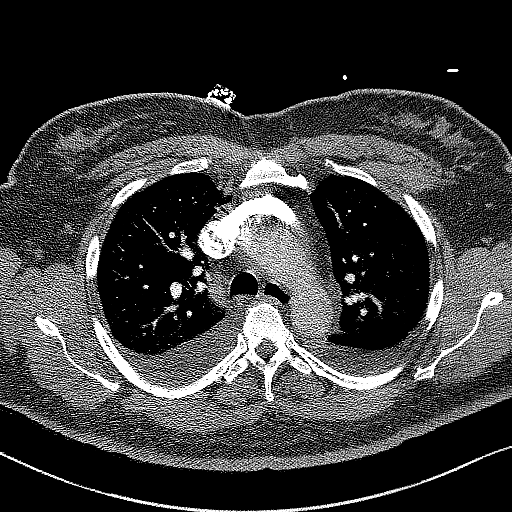
[im 97/122  mediastinal]
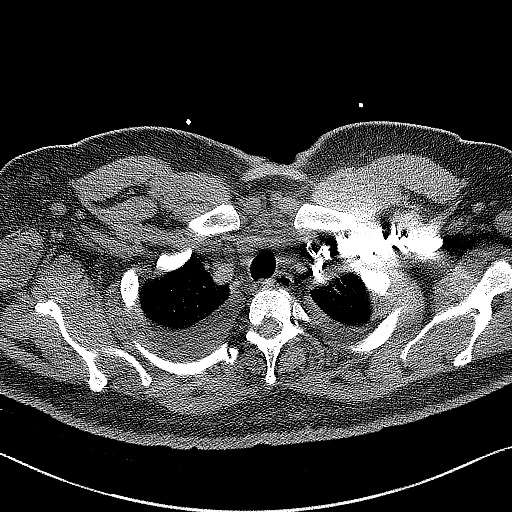

[Series 6: thins · axial · 0.75mm/px · z∈[+1475,+1726]mm · 13 of 293 slices shown]
[im 21/293  lung]
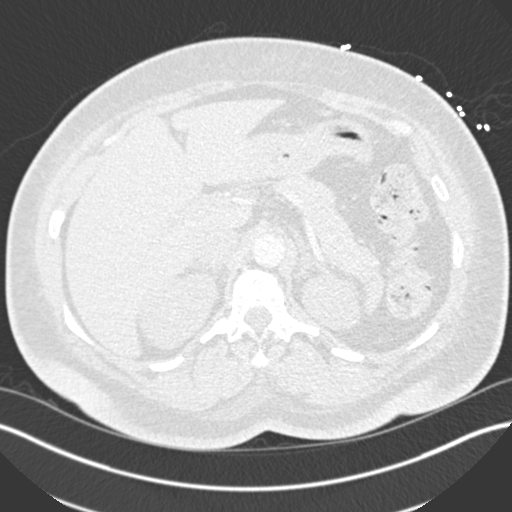
[im 42/293  mediastinal]
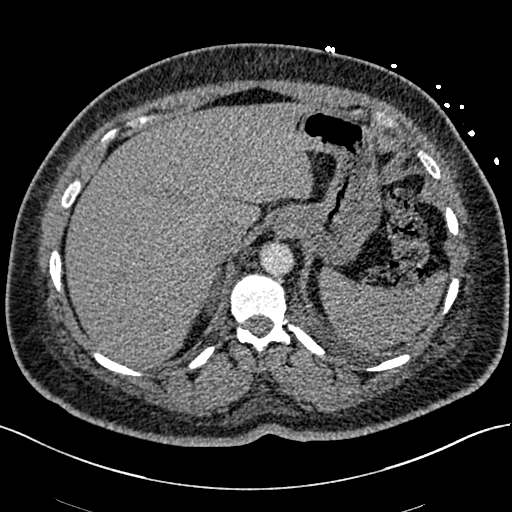
[im 63/293  lung]
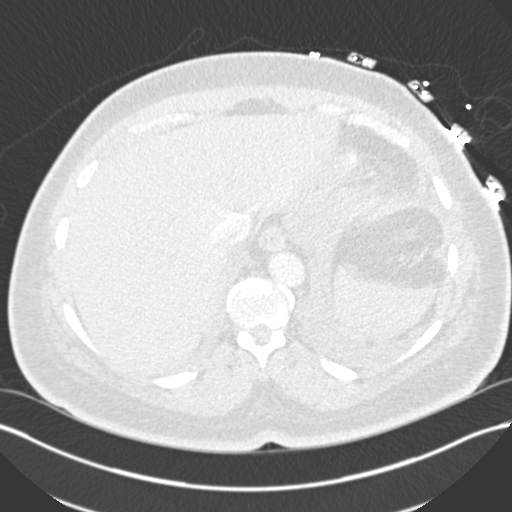
[im 84/293  mediastinal]
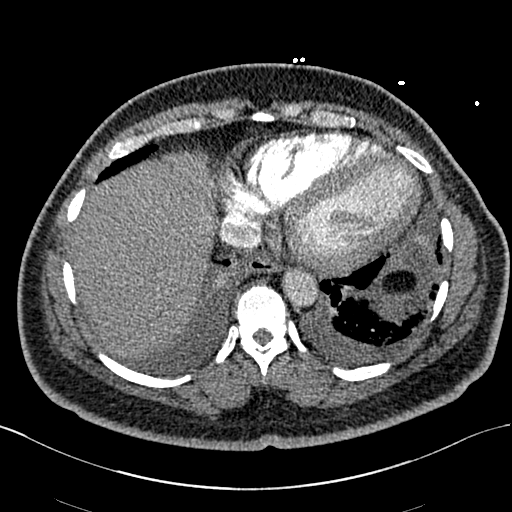
[im 105/293  lung]
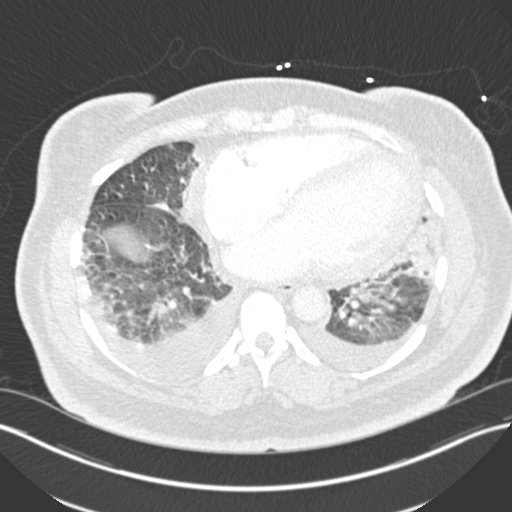
[im 126/293  mediastinal]
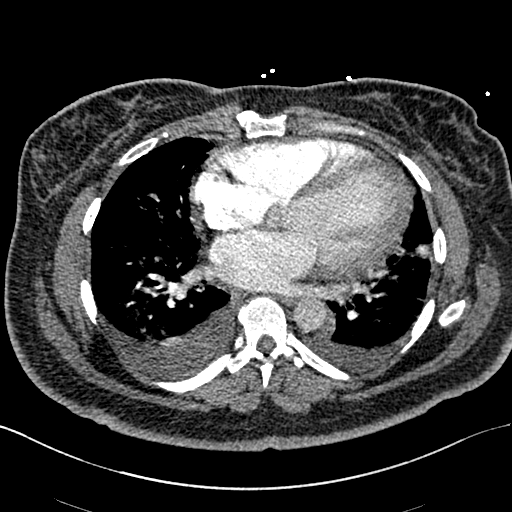
[im 147/293  lung]
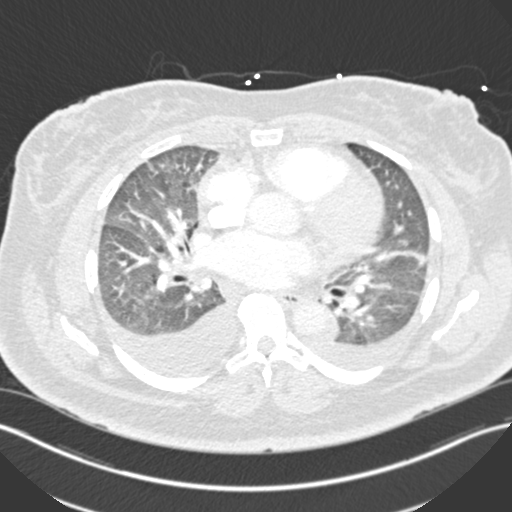
[im 167/293  mediastinal]
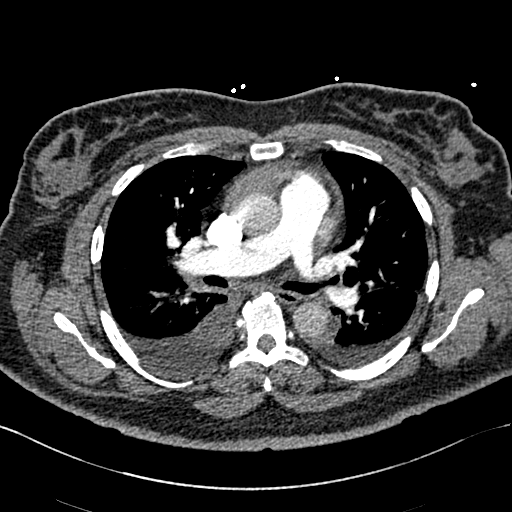
[im 188/293  lung]
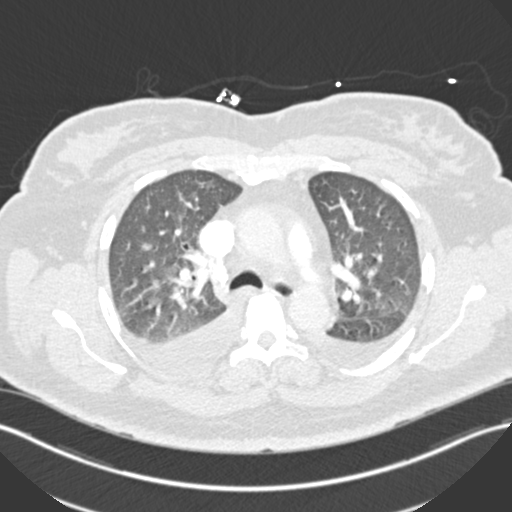
[im 209/293  mediastinal]
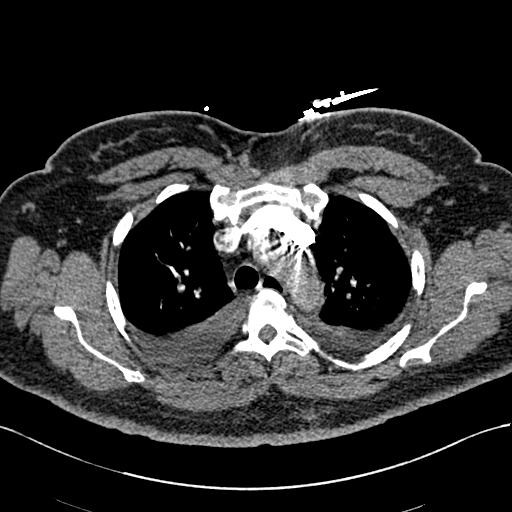
[im 230/293  lung]
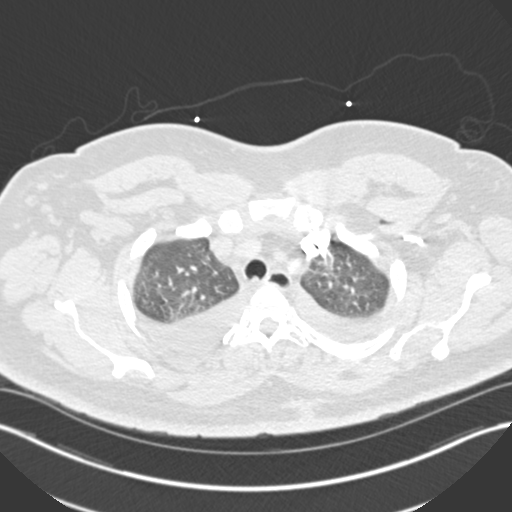
[im 251/293  mediastinal]
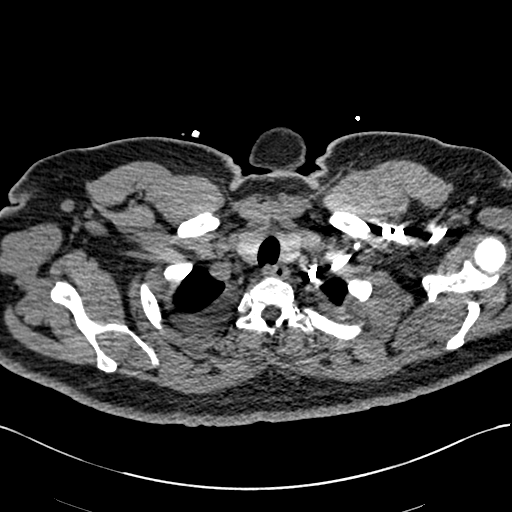
[im 272/293  lung]
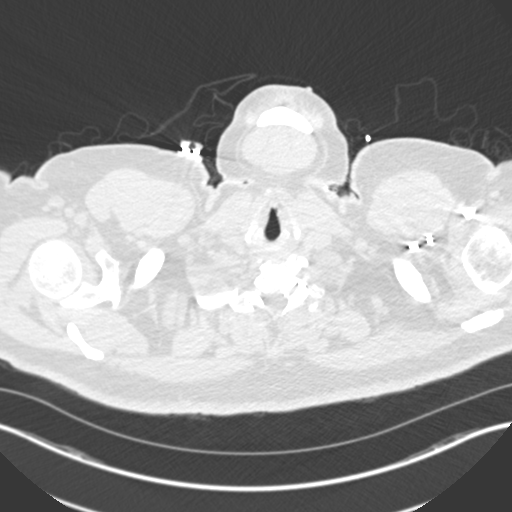

[Series 7: coronal mpr · coronal · 0.52mm/px · 1 of 157 slices shown]
[im 79/157  mediastinal]
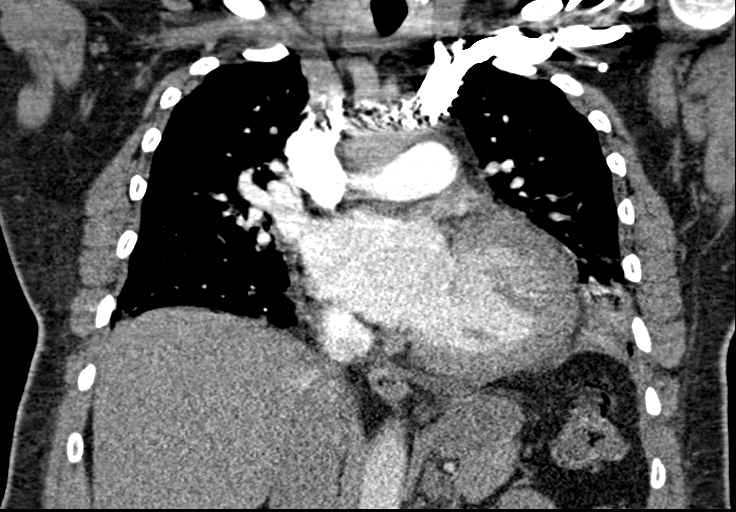

[18 of 36 positions shown; findings below may reference images not displayed]

FINDINGS: Cardiovascular: No pulmonary embolus is identified. There is marked
cardiomegaly. Minimal aortic atherosclerosis is seen. No aortic
aneurysm or pericardial effusion.

Mediastinum/Nodes: No enlarged mediastinal, hilar, or axillary lymph
nodes. Thyroid gland, trachea, and esophagus demonstrate no
significant findings.

Lungs/Pleura: Small to moderate bilateral pleural effusions are
present, greater on the right. There is scattered interlobular
septal thickening and ground-glass attenuation. Mild basilar
atelectasis noted.

Upper Abdomen: Status post cholecystectomy. No acute or focal
abnormality.

Musculoskeletal: No lytic or sclerotic lesion. No acute bony
abnormality.

Review of the MIP images confirms the above findings.
IMPRESSION: Negative for pulmonary embolus.

Findings consistent with pulmonary edema with associated small to
moderate pleural effusions, larger on the right. Marked cardiomegaly
also noted.

Mild aortic atherosclerosis ([id number]).

## 2020-03-20 IMAGING — DX DG CHEST 2V
2 series · 2 of 2 positions shown · non-contrast
Comparison: 11/15/2019

CLINICAL DATA: Evaluate pneumonia.

EXAM:
CHEST - 2 VIEW

[chest pa]
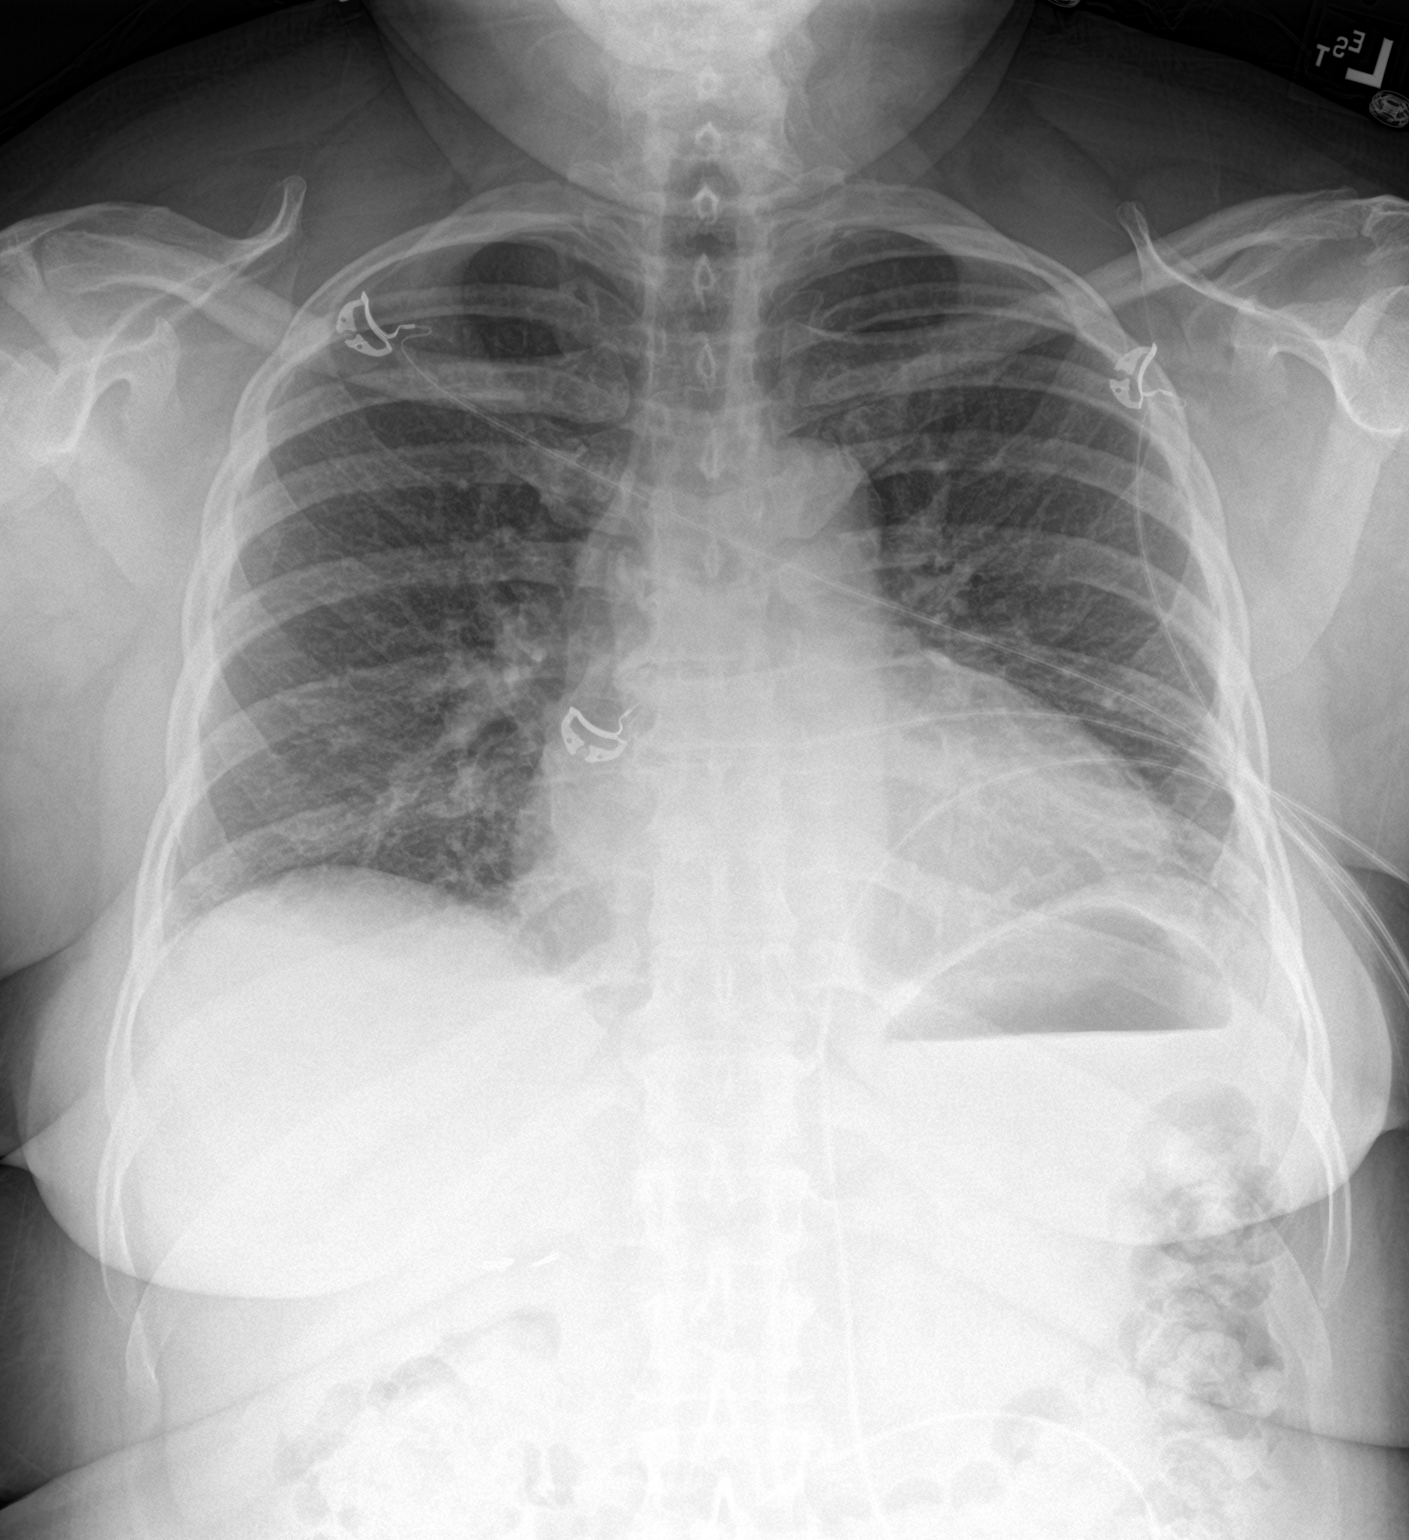

[chest lat]
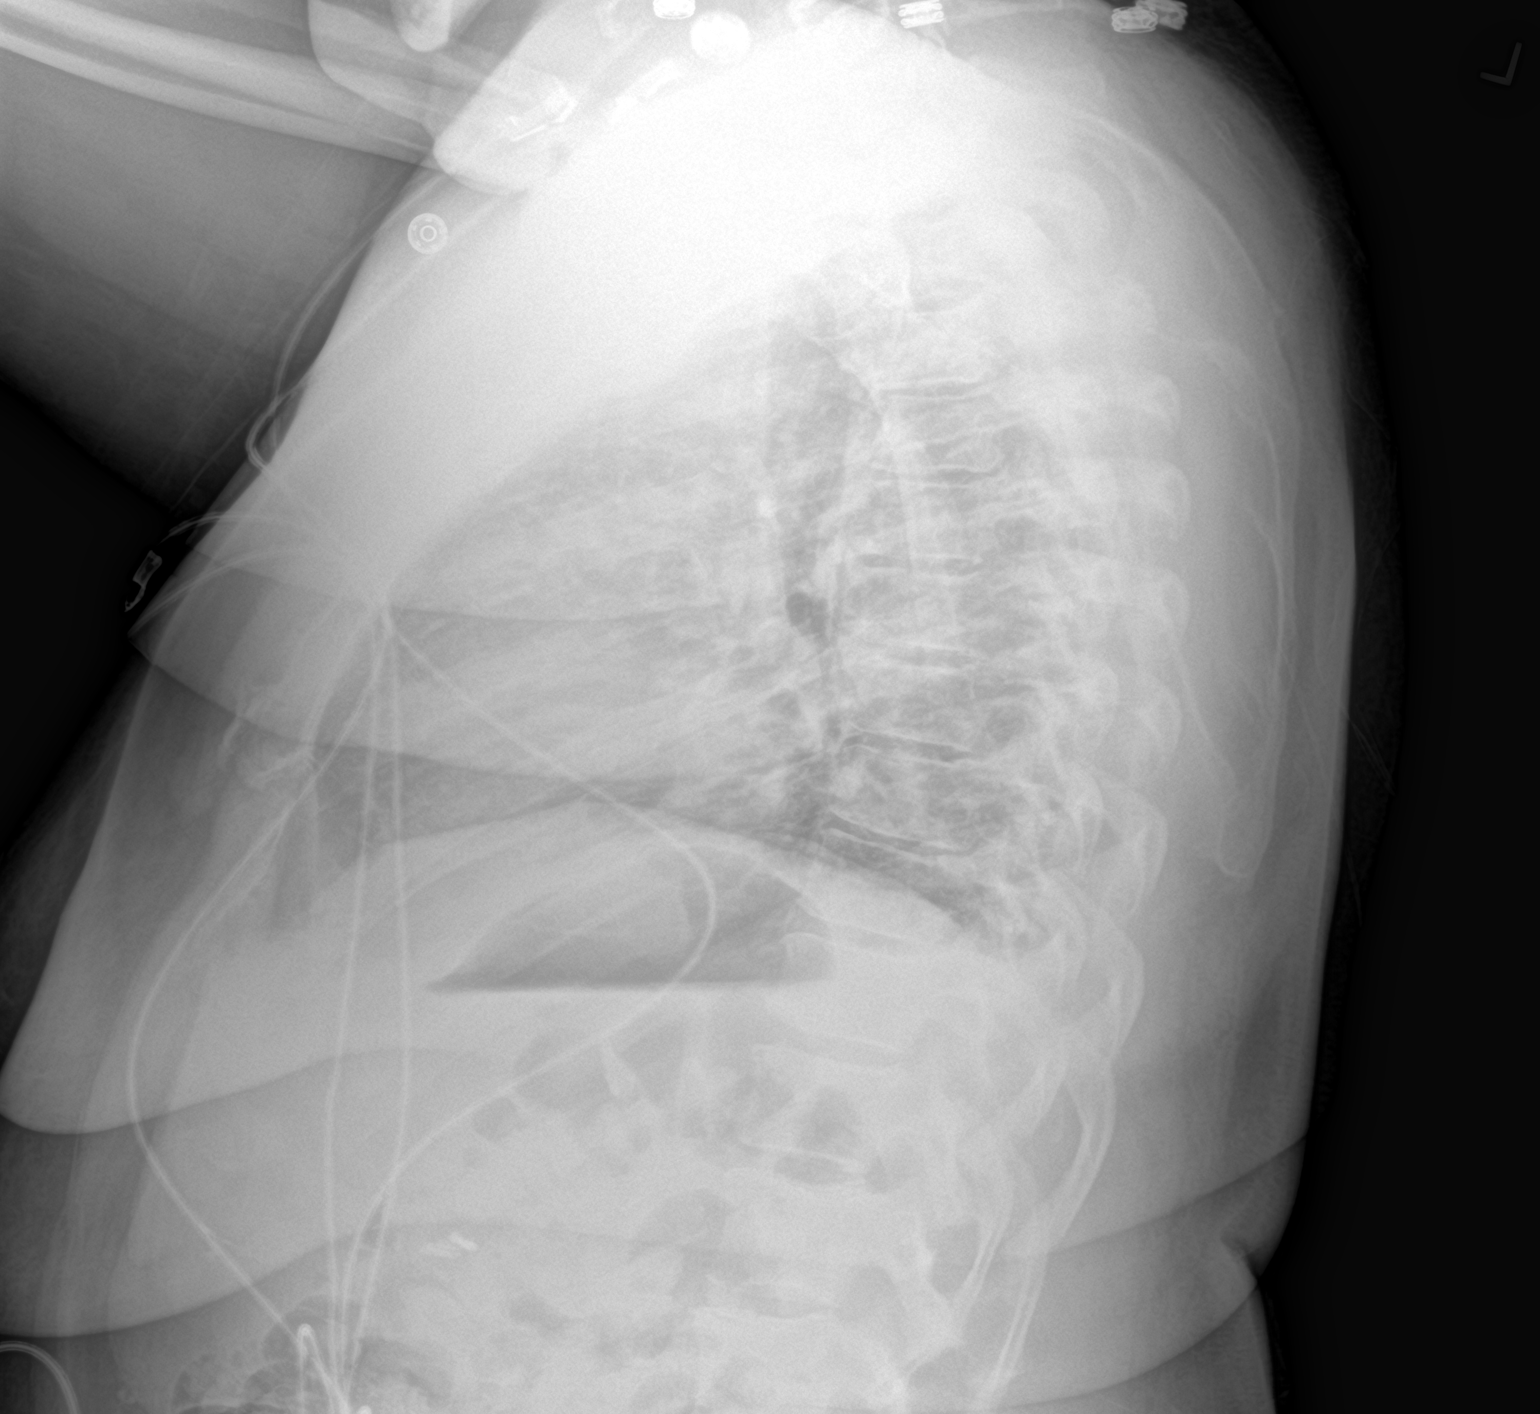

[2 of 2 positions shown; findings below may reference images not displayed]

FINDINGS: Lungs are hypoinflated with interval improvement in the perihilar
vessels likely improving/resolving vascular congestion. No evidence
of effusion. Borderline stable cardiomegaly. Remainder of the exam
is unchanged.
IMPRESSION: Interval improvement in the perihilar vessels suggesting
improving/resolving mild vascular congestion.

## 2020-05-05 DIAGNOSIS — E119 Type 2 diabetes mellitus without complications: Secondary | ICD-10-CM | POA: Insufficient documentation

## 2020-05-05 HISTORY — DX: Type 2 diabetes mellitus without complications: E11.9

## 2020-05-07 ENCOUNTER — Other Ambulatory Visit: Payer: Self-pay

## 2020-05-07 ENCOUNTER — Encounter (HOSPITAL_COMMUNITY): Payer: Self-pay | Admitting: Internal Medicine

## 2020-05-07 ENCOUNTER — Ambulatory Visit (HOSPITAL_COMMUNITY)
Admission: RE | Admit: 2020-05-07 | Discharge: 2020-05-07 | Disposition: A | Discharge status: Home / Self Care | Payer: BC Managed Care – PPO | Source: Ambulatory Visit | Attending: Internal Medicine | Admitting: Internal Medicine

## 2020-05-07 VITALS — BP 118/74 | HR 77 | Wt 240.0 lb

## 2020-05-07 DIAGNOSIS — I5022 Chronic systolic (congestive) heart failure: Secondary | ICD-10-CM | POA: Diagnosis not present

## 2020-05-07 DIAGNOSIS — Z79899 Other long term (current) drug therapy: Secondary | ICD-10-CM | POA: Diagnosis not present

## 2020-05-07 DIAGNOSIS — F419 Anxiety disorder, unspecified: Secondary | ICD-10-CM | POA: Diagnosis not present

## 2020-05-07 DIAGNOSIS — E669 Obesity, unspecified: Secondary | ICD-10-CM | POA: Diagnosis not present

## 2020-05-07 DIAGNOSIS — I10 Essential (primary) hypertension: Secondary | ICD-10-CM

## 2020-05-07 DIAGNOSIS — I428 Other cardiomyopathies: Secondary | ICD-10-CM | POA: Insufficient documentation

## 2020-05-07 DIAGNOSIS — I110 Hypertensive heart disease with heart failure: Secondary | ICD-10-CM | POA: Diagnosis not present

## 2020-05-07 DIAGNOSIS — Z6837 Body mass index (BMI) 37.0-37.9, adult: Secondary | ICD-10-CM | POA: Insufficient documentation

## 2020-05-07 DIAGNOSIS — F319 Bipolar disorder, unspecified: Secondary | ICD-10-CM | POA: Insufficient documentation

## 2020-05-07 DIAGNOSIS — Z8249 Family history of ischemic heart disease and other diseases of the circulatory system: Secondary | ICD-10-CM | POA: Insufficient documentation

## 2020-05-07 DIAGNOSIS — G4733 Obstructive sleep apnea (adult) (pediatric): Secondary | ICD-10-CM

## 2020-05-07 DIAGNOSIS — G5601 Carpal tunnel syndrome, right upper limb: Secondary | ICD-10-CM | POA: Diagnosis not present

## 2020-05-07 LAB — BASIC METABOLIC PANEL
Anion gap: 10 (ref 5–15)
BUN: 18 mg/dL (ref 6–20)
CO2: 23 mmol/L (ref 22–32)
Calcium: 8.9 mg/dL (ref 8.9–10.3)
Chloride: 102 mmol/L (ref 98–111)
Creatinine, Ser: 1.17 mg/dL — ABNORMAL HIGH (ref 0.44–1.00)
GFR calc Af Amer: >60 mL/min (ref >60)
GFR calc non Af Amer: 55 mL/min — ABNORMAL LOW (ref >60)
Glucose, Bld: 108 mg/dL — ABNORMAL HIGH (ref 70–99)
Potassium: 4.1 mmol/L (ref 3.5–5.1)
Sodium: 135 mmol/L (ref 135–145)

## 2020-05-07 LAB — BRAIN NATRIURETIC PEPTIDE: B Natriuretic Peptide: 18 pg/mL (ref 0.0–100.0)

## 2020-05-07 MED ORDER — ATORVASTATIN CALCIUM 40 MG PO TABS: 40.0000 mg | ORAL_TABLET | Freq: Every day | ORAL | 3 refills | Status: DC

## 2020-05-07 MED ORDER — FUROSEMIDE 20 MG PO TABS: 20.0000 mg | ORAL_TABLET | Freq: Every day | ORAL | 3 refills | Status: DC

## 2020-05-07 MED ORDER — SPIRONOLACTONE 25 MG PO TABS: 25.0000 mg | ORAL_TABLET | Freq: Every day | ORAL | 3 refills | Status: DC

## 2020-05-07 MED ORDER — ISOSORB DINITRATE-HYDRALAZINE 20-37.5 MG PO TABS: 1.0000 | ORAL_TABLET | Freq: Three times a day (TID) | ORAL | 3 refills | Status: DC

## 2020-05-07 MED ORDER — SACUBITRIL-VALSARTAN 97-103 MG PO TABS: 1.0000 | ORAL_TABLET | Freq: Two times a day (BID) | ORAL | 3 refills | Status: DC

## 2020-05-07 MED ORDER — CARVEDILOL 12.5 MG PO TABS: 12.5000 mg | ORAL_TABLET | Freq: Two times a day (BID) | ORAL | 3 refills | Status: DC

## 2020-05-07 NOTE — Patient Instructions (Signed)
Labs today We will only contact you if something comes back abnormal or we need to make some changes. Otherwise no news is good news!  You have been referred to [facility] Address: [address] [place], [place] [address] Phone: [phone] -they will be in contact with an appointment  Cong[facility] on graduation from the [facility], you may come back to see [place] if needed

## 2020-05-07 NOTE — Progress Notes (Signed)
PCP: Dr Nedra Hai Primary Cardiologist: Dr Gala Romney   HPI: Lisa Sellers is a 49 year old with a history of HTN, bipolar, and systolic HF due to NICM with reduced EF < 20%.   Admitted to [facility] on 11/15/19 with increased dyspnea and edema. ECHO completed and showed reduced LV EF <20%. Had cath to further evaluate and this showed normal coronaries, severe NICM, and normal cardiac output. Diuresed with IV lasix and HF meds.   On 11/25/19 she had doppler of RUE due to pain. This showed R radial occlusion.   PSG 3/21 AHI 28. Using CPAP. Follows with Dr. Mayford Knife   Echo 4/21 EF 55-60%   Here for HF f/u with her husband. Feels great. Exercising 2 hours per day. No CP or SOB. Occasional edema which resolves with lasix. Wearing CPAP about 4 hours per night. Has lost 10 pounds.   Studies:  ECHO 11/15/19-->LV EF <20% RV mildly reduced  Echo 4/21 EF 55-60%   RHC 11/18/19 Ao = 87/59 (71) LV = 92/2 RA = 2 RV = 24/4 PA = 24/2 (14) PCW = 6 Fick cardiac output/index = 7.6/3.4 PVR = 1.1 WU SVR = 728 Ao sat = 95% PA sat = 64%, 64% Assessment: 1. Normal coronary arteries 2. 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 Well compensated (low) filling pressures with normal cardiac output   ROS: All systems negative except as listed in HPI, PMH and Problem List.  SH:  Social History   Socioeconomic History   Marital status: Married    Spouse name: Not on file   Number of children: 1   Years of education: Not on file   Highest education level: Not on file  Occupational History   Not on file  Tobacco Use   Smoking status: Never Smoker   Smokeless tobacco: Never Used  Substance and Sexual Activity   Alcohol use: No    Alcohol/week: 0.0 standard drinks   Drug use: No   Sexual activity: Not on file  Other Topics Concern   Not on file  Social History Narrative   Not on file   Social Determinants of Health   Financial Resource Strain:    Difficulty of Paying Living Expenses:   Food Insecurity: No Food  Insecurity   Worried About Running Out of Food in the Last Year: Never true   Ran Out of Food in the Last Year: Never true  Transportation Needs: No Transportation Needs   Lack of Transportation (Medical): No   Lack of Transportation (Non-Medical): No  Physical Activity: Sufficiently Active   Days of Exercise per Week: 7 days   Minutes of Exercise per Session: 60 min  Stress:    Feeling of Stress :   Social Connections:    Frequency of Communication with Friends and Family:    Frequency of Social Gatherings with Friends and Family:    Attends Religious Services:    Active Member of Clubs or Organizations:    Attends Engineer, structural:    Marital Status:   Intimate Partner Violence:    Fear of Current or Ex-Partner:    Emotionally Abused:    Physically Abused:    Sexually Abused:     FH:  Family History  Problem Relation Age of Onset   Heart failure Father    Diabetes Father    Congestive Heart Failure Other     Past Medical History:  Diagnosis Date   Anxiety    HTN (hypertension)     Current Outpatient  Medications  Medication Sig Dispense Refill   atorvastatin (LIPITOR) 40 MG tablet Take by mouth.     carvedilol (COREG) 12.5 MG tablet Take 1 tablet (12.5 mg total) by mouth 2 (two) times daily with a meal. 180 tablet 2   Ferrous Fumarate (IRON) 18 MG TBCR Take by mouth daily.      furosemide (LASIX) 20 MG tablet Take 1 tablet (20 mg total) by mouth daily. 90 tablet 2   gabapentin (NEURONTIN) 100 MG capsule Take 100 mg by mouth 3 (three) times daily. Take 1 tab bid     isosorbide-hydrALAZINE (BIDIL) 20-37.5 MG tablet Take 1 tablet by mouth 3 (three) times daily. 270 tablet 2   lamoTRIgine (LAMICTAL) 200 MG tablet Take 400 mg by mouth at bedtime.      LORazepam (ATIVAN) 0.5 MG tablet Take 0.5 mg by mouth daily as needed for anxiety.      sacubitril-valsartan (ENTRESTO) 97-103 MG Take 1 tablet by mouth 2 (two) times daily. 180  tablet 2   spironolactone (ALDACTONE) 25 MG tablet Take 1 tablet (25 mg total) by mouth daily. 90 tablet 2   No current facility-administered medications for this encounter.    Vitals:   05/07/20 1020  BP: 118/74  Pulse: 77  SpO2: 98%  Weight: 108.9 kg (240 lb)   Wt Readings from Last 3 Encounters:  05/07/20 108.9 kg (240 lb)  02/14/20 112.9 kg (249 lb)  02/06/20 116.3 kg (256 lb 6.4 oz)    PHYSICAL EXAM: General:  Well appearing. No resp difficulty HEENT: normal Neck: supple. no JVD. Carotids 2+ bilat; no bruits. No lymphadenopathy or thryomegaly appreciated. Cor: PMI nondisplaced. Regular rate & rhythm. No rubs, gallops or murmurs. Lungs: clear Abdomen: obese soft, nontender, nondistended. No hepatosplenomegaly. No bruits or masses. Good bowel sounds. Extremities: no cyanosis, clubbing, rash, edema Neuro: alert & orientedx3, cranial nerves grossly intact. moves all 4 extremities w/o difficulty. Affect pleasant Allens test today - has flow on bot radial and ulnar side. Perhaps slightly sluggish on radial but not much diffrerence  ASSESSMENT & PLAN: 1. Chronic Systolic HF, NICM Had LHC/RHC on 11/18/19 with normal coronaries and preserved cardiac output. ECHO 11/15/19 showed LVEF was down < 20% suspect due to HTN and OSA.  - Echo 4/21 EF 55-60% (EF improved with medical therapy and treatment of HTN and OSA) - NYHA I  Volume status stable.  Use lasix prn - Continue carvedilol  12.5 mg mg twice a day - Continue entresto 97-103  mg twice a day  - Continue spironolactone 25 mg daily.  - Continue Bidil 1 tab three times a day.   2. HTN - Blood pressure well controlled. Continue current regimen.  3. OSA - PSG 3/21 AHI 28 - Using CPAP. Follows with Dr. Mayford Knife   4. Obesity  Body mass index is 37.59 kg/m.  - working on weight loss  5. R forearm pain -Had LHC on 11/18/19 -11/25/19 Had R radial occlusion - Improving. Allen's test as above. Also has some carpal tunnel symptoms on  right.   Can graduate HF clinic refer to [facility]. Stressed need to continue meds. If BP comes down dramatically with weight loss would stop Bidil first.   Lisa Meres MD 10:40 AM

## 2020-06-05 ENCOUNTER — Telehealth (HOSPITAL_COMMUNITY): Payer: Self-pay | Admitting: Cardiology

## 2020-06-05 MED ORDER — FUROSEMIDE 20 MG PO TABS: 20.0000 mg | ORAL_TABLET | Freq: Every day | ORAL | 1 refills | Status: DC

## 2020-06-05 NOTE — Telephone Encounter (Signed)
Pt called to request refills at local pharmacy until mail order shipment comes in   Refills returned to pharmacy as requested

## 2020-06-19 ENCOUNTER — Ambulatory Visit: Payer: BC Managed Care – PPO | Admitting: Cardiology

## 2020-07-19 NOTE — Progress Notes (Addendum)
Date:  07/20/2020   ID:  Lisa Sellers, DOB 03/17/1971, MRN [id number]  Patient Location:  Home  Provider location:   [place]  PCP:  Catha Gosselin, MD  Cardiologist:  Arvilla Meres, MD Sleep Medicine:  Armanda Magic, MD Electrophysiologist:  None   Chief Complaint:  OSA  History of Present Illness:    Lisa Sellers is a 49 y.o. female with a hx of chronic systolic CHF, NICM (EF initially 20-25% and resolved with EF 55-60% on echo in April 2021), HTN who was referred for sleep study.  Home sleep study revealed moderate OSA with an AHI of 28/hr and she was started on auto CPAP.    She is doing well with her CPAP device and thinks that she has gotten used to it.  She tolerates the mask and feels the pressure is adequate.  Since going on CPAP she feels rested in the am and has no significant daytime sleepiness.  She occasionally has some mild mouth dryness and occasional nasal congestion.  She does not think that he snores.     Prior CV studies:   The following studies were reviewed today:  Home sleep study and PAP compliance download  Past Medical History:  Diagnosis Date  . Anxiety   . Chronic systolic CHF (congestive heart failure) (HCC)   . DCM (dilated cardiomyopathy) (HCC)    EF initially 20-25% on echo with cath showing normal coronary arteries and normalized at 55-60% on echo April 2021  . HTN (hypertension)   . OSA on CPAP    moderate with AHI 28/hr now on auto CPAP   Past Surgical History:  Procedure Laterality Date  . CHOLECYSTECTOMY    . FRACTURE SURGERY    . OOPHORECTOMY Right   . OOPHORECTOMY    . RIGHT/LEFT HEART CATH AND CORONARY ANGIOGRAPHY N/A 11/18/2019   Procedure: RIGHT/LEFT HEART CATH AND CORONARY ANGIOGRAPHY;  Surgeon: Dolores Patty, MD;  Location: [facility];  Service: Cardiovascular;  Laterality: N/A;     Current Meds  Medication Sig  . atorvastatin (LIPITOR) 40 MG tablet Take 1 tablet (40 mg total) by  mouth daily.  . carvedilol (COREG) 12.5 MG tablet Take 1 tablet (12.5 mg total) by mouth 2 (two) times daily with a meal.  . Ferrous Fumarate (IRON) 18 MG TBCR Take by mouth daily.   . furosemide (LASIX) 20 MG tablet Take 1 tablet (20 mg total) by mouth daily.  Marland Kitchen gabapentin (NEURONTIN) 100 MG capsule Take 100 mg by mouth 3 (three) times daily. Take 1 tab bid  . isosorbide-hydrALAZINE (BIDIL) 20-37.5 MG tablet Take 1 tablet by mouth 3 (three) times daily.  Marland Kitchen lamoTRIgine (LAMICTAL) 200 MG tablet Take 400 mg by mouth at bedtime.   Marland Kitchen LORazepam (ATIVAN) 0.5 MG tablet Take 0.5 mg by mouth daily as needed for anxiety.   . Omega-3 Fatty Acids (FISH OIL PO) Take 1 capsule by mouth daily.  . sacubitril-valsartan (ENTRESTO) 97-103 MG Take 1 tablet by mouth 2 (two) times daily.  Marland Kitchen spironolactone (ALDACTONE) 25 MG tablet Take 1 tablet (25 mg total) by mouth daily.     Allergies:   Penicillins   Social History   Tobacco Use  . Smoking status: Never Smoker  . Smokeless tobacco: Never Used  Substance Use Topics  . Alcohol use: No    Alcohol/week: 0.0 standard drinks  . Drug use: No     Family Hx: The patient's family history includes Congestive Heart Failure in  an other family member; Diabetes in her father; Heart failure in her father.  ROS:   Please see the history of present illness.     All other systems reviewed and are negative.   Labs/Other Tests and Data Reviewed:    Recent Labs: 11/15/2019: TSH 3.123 11/17/2019: Platelets 346 11/18/2019: Hemoglobin 10.9 05/07/2020: B Natriuretic Peptide 18.0; BUN 18; Creatinine, Ser 1.17; Potassium 4.1; Sodium 135   Recent Lipid Panel No results found for: CHOL, TRIG, HDL, CHOLHDL, LDLCALC, LDLDIRECT  Wt Readings from Last 3 Encounters:  07/20/20 240 lb 9.6 oz (109.1 kg)  05/07/20 240 lb (108.9 kg)  02/14/20 249 lb (112.9 kg)     Objective:    Vital Signs:  BP (!) 138/58   Pulse 83   Ht 5\' 7"  (1.702 m)   Wt 240 lb 9.6 oz (109.1 kg)    SpO2 94%   BMI 37.68 kg/m    CONSTITUTIONAL:  Well nourished, well developed female in no acute distress.  EYES: anicteric MOUTH: oral mucosa is pink RESPIRATORY: Normal respiratory effort, symmetric expansion CARDIOVASCULAR: No peripheral edema SKIN: No rash, lesions or ulcers MUSCULOSKELETAL: no digital cyanosis NEURO: Cranial Nerves II-XII grossly intact, moves all extremities PSYCH: Intact judgement and insight.  A&O x 3, Mood/affect appropriate   ASSESSMENT & PLAN:    1.  OSA -   The patient is tolerating PAP therapy well without any problems. The PAP download was reviewed today and showed an AHI of 3.2/hr on auto CPAP cm H2O with 43% compliance in using more than 4 hours nightly.  The patient has been using and benefiting from PAP use and will continue to benefit from therapy.  -I have encouraged her to be more compliant with her PAP device  2.  HTN -BP well controlled on exam today.   -continue Entresto 97-103mg  BID, Bidil 20-37.5mg  TID, spiro 25mg  daily and Carvedilol 12.5mg  BID.  -SCr normal at 1.17 an dK+ 4.1 in July 2021  3.  Obesity -I have encouraged him to get into a routine exercise program and cut back on carbs and portions.   4.  NIDCM -likely related to OSA and HTN -EF now 55-60% with normal coronary arteries on cath  -she appears euvolemic on exam -continue Entresto, Bidil, spiro and carvedilol  Medication Adjustments/Labs and Tests Ordered: Current medicines are reviewed at length with the patient today.  Concerns regarding medicines are outlined above.  Tests Ordered: No orders of the defined types were placed in this encounter.  Medication Changes: No orders of the defined types were placed in this encounter.   Disposition:  Follow up in 1 year(s)  Signed, , MD  07/20/2020 8:57 AM    [facility]

## 2020-07-20 ENCOUNTER — Encounter: Payer: Self-pay | Admitting: Cardiology

## 2020-07-20 ENCOUNTER — Ambulatory Visit (INDEPENDENT_AMBULATORY_CARE_PROVIDER_SITE_OTHER): Payer: BC Managed Care – PPO | Admitting: Cardiology

## 2020-07-20 ENCOUNTER — Other Ambulatory Visit: Payer: Self-pay

## 2020-07-20 VITALS — BP 138/58 | HR 83 | Ht 67.0 in | Wt 240.6 lb

## 2020-07-20 DIAGNOSIS — I10 Essential (primary) hypertension: Secondary | ICD-10-CM

## 2020-07-20 DIAGNOSIS — G4733 Obstructive sleep apnea (adult) (pediatric): Secondary | ICD-10-CM

## 2020-07-20 DIAGNOSIS — I420 Dilated cardiomyopathy: Secondary | ICD-10-CM | POA: Diagnosis not present

## 2020-07-20 DIAGNOSIS — Z9989 Dependence on other enabling machines and devices: Secondary | ICD-10-CM

## 2020-07-20 DIAGNOSIS — E669 Obesity, unspecified: Secondary | ICD-10-CM

## 2020-07-20 DIAGNOSIS — I5022 Chronic systolic (congestive) heart failure: Secondary | ICD-10-CM | POA: Insufficient documentation

## 2020-07-20 NOTE — Patient Instructions (Signed)

## 2020-09-07 ENCOUNTER — Other Ambulatory Visit: Payer: Self-pay

## 2020-09-07 MED ORDER — FUROSEMIDE 20 MG PO TABS: 20.0000 mg | ORAL_TABLET | Freq: Every day | ORAL | 0 refills | Status: DC

## 2020-09-07 NOTE — Telephone Encounter (Signed)
Pt called to request refill for FUROSEMIDE 20 mg tablets.There was a disruption in supply through her mail order pharmacy so [facility] so she [facility] not run out she currently has 2 pills left.

## 2020-09-08 ENCOUNTER — Other Ambulatory Visit (HOSPITAL_COMMUNITY): Payer: Self-pay

## 2020-09-08 MED ORDER — FUROSEMIDE 20 MG PO TABS: 20.0000 mg | ORAL_TABLET | Freq: Every day | ORAL | 3 refills | Status: DC

## 2020-09-14 ENCOUNTER — Other Ambulatory Visit (HOSPITAL_COMMUNITY): Payer: Self-pay | Admitting: Adult Health

## 2020-09-28 DIAGNOSIS — E559 Vitamin D deficiency, unspecified: Secondary | ICD-10-CM

## 2020-09-28 HISTORY — DX: Vitamin D deficiency, unspecified: E55.9

## 2021-04-12 ENCOUNTER — Other Ambulatory Visit (HOSPITAL_COMMUNITY): Payer: Self-pay | Admitting: Internal Medicine

## 2021-04-13 NOTE — Telephone Encounter (Signed)
Patient graduated from the heart failure clinic last year and was referred to [facility] to general cardiology.

## 2021-04-13 NOTE — Telephone Encounter (Signed)
This is a CHF pt. Pt sees Dr. Mayford Knife for Sleep. Please address

## 2021-04-14 NOTE — Telephone Encounter (Signed)
Pt has not seen a primary cardiologist yet and only sees Dr. Mayford Knife for sleep. Would Dr. Gala Romney like to refill this medication until pt has a primary cardiologist? Carlyle,RN, pt sees Dr. Mayford Knife for sleep, will Dr. Mayford Knife refill pt's medication atorvastatin, if she sees pt only for sleep? Please address

## 2021-05-24 ENCOUNTER — Other Ambulatory Visit (HOSPITAL_COMMUNITY): Payer: Self-pay | Admitting: Internal Medicine

## 2021-06-15 ENCOUNTER — Other Ambulatory Visit (HOSPITAL_COMMUNITY): Payer: Self-pay | Admitting: Internal Medicine

## 2021-07-03 ENCOUNTER — Other Ambulatory Visit (HOSPITAL_COMMUNITY): Payer: Self-pay | Admitting: Internal Medicine

## 2021-07-16 ENCOUNTER — Ambulatory Visit (INDEPENDENT_AMBULATORY_CARE_PROVIDER_SITE_OTHER): Payer: BC Managed Care – PPO | Admitting: Cardiology

## 2021-07-16 ENCOUNTER — Encounter: Payer: Self-pay | Admitting: Cardiology

## 2021-07-16 ENCOUNTER — Other Ambulatory Visit: Payer: Self-pay

## 2021-07-16 VITALS — BP 106/54 | HR 77 | Ht 67.0 in | Wt 229.0 lb

## 2021-07-16 DIAGNOSIS — Z9989 Dependence on other enabling machines and devices: Secondary | ICD-10-CM

## 2021-07-16 DIAGNOSIS — I10 Essential (primary) hypertension: Secondary | ICD-10-CM | POA: Diagnosis not present

## 2021-07-16 DIAGNOSIS — I420 Dilated cardiomyopathy: Secondary | ICD-10-CM | POA: Diagnosis not present

## 2021-07-16 DIAGNOSIS — G4733 Obstructive sleep apnea (adult) (pediatric): Secondary | ICD-10-CM | POA: Diagnosis not present

## 2021-07-16 LAB — BASIC METABOLIC PANEL
BUN/Creatinine Ratio: 17 (ref 9–23)
BUN: 17 mg/dL (ref 6–24)
CO2: 24 mmol/L (ref 20–29)
Calcium: 9.1 mg/dL (ref 8.7–10.2)
Chloride: 103 mmol/L (ref 96–106)
Creatinine, Ser: 1 mg/dL (ref 0.57–1.00)
Glucose: 128 mg/dL — ABNORMAL HIGH (ref 70–99)
Potassium: 4.1 mmol/L (ref 3.5–5.2)
Sodium: 139 mmol/L (ref 134–144)
eGFR: 69 mL/min/{1.73_m2} (ref >59)

## 2021-07-16 MED ORDER — SPIRONOLACTONE 25 MG PO TABS: 25.0000 mg | ORAL_TABLET | Freq: Every day | ORAL | 3 refills | Status: DC

## 2021-07-16 MED ORDER — ENTRESTO 97-103 MG PO TABS: 1.0000 | ORAL_TABLET | Freq: Two times a day (BID) | ORAL | 3 refills | Status: DC

## 2021-07-16 MED ORDER — CARVEDILOL 12.5 MG PO TABS: 12.5000 mg | ORAL_TABLET | Freq: Two times a day (BID) | ORAL | 3 refills | Status: DC

## 2021-07-16 MED ORDER — FUROSEMIDE 20 MG PO TABS: 20.0000 mg | ORAL_TABLET | Freq: Every day | ORAL | 3 refills | Status: DC

## 2021-07-16 MED ORDER — ISOSORB DINITRATE-HYDRALAZINE 20-37.5 MG PO TABS: 1.0000 | ORAL_TABLET | Freq: Three times a day (TID) | ORAL | 3 refills | Status: DC

## 2021-07-16 NOTE — Patient Instructions (Signed)
Medication Instructions:  Your physician recommends that you continue on your current medications as directed. Please refer to the Current Medication list given to you today.  *If you need a refill on your cardiac medications before your next appointment, please call your pharmacy*   Lab Work: TODAY: BMET If you have labs (blood work) drawn today and your tests are completely normal, you will receive your results only by: MyChart Message (if you have MyChart) OR A paper copy in the mail If you have any lab test that is abnormal or we need to change your treatment, we will call you to review the results.   Follow-Up: At [facility], you and your health needs are our priority.  As part of our continuing mission to provide you with exceptional heart care, we have created designated Provider Care Teams.  These Care Teams include your primary Cardiologist (physician) and Advanced Practice Providers (APPs -  Physician Assistants and Nurse Practitioners) who all work together to provide you with the care you need, when you need it.   Your next appointment:   1 year(s)  The format for your next appointment:   In Person  Provider:   You may see Traci Turner, MD or one of the following Advanced Practice Providers on your designated Care Team:   Dayna Dunn, PA-C Michele Lenze, PA-C   

## 2021-07-16 NOTE — Progress Notes (Signed)
Date:  07/16/2021   ID:  Lisa Sellers, DOB [birth year], MRN [id number]  Patient Location:  Home  Provider location:   [place]  PCP:  Catha Gosselin, MD  Cardiologist:  Arvilla Meres, MD Sleep Medicine:  Armanda Magic, MD Electrophysiologist:  None   Chief Complaint:  OSA  History of Present Illness:    Lisa Sellers is a 50 y.o. female with a hx of chronic systolic CHF, NICM (EF initially 20-25% and resolved with EF 55-60% on echo in April 2021), HTN who was referred for sleep study.  Home sleep study revealed moderate OSA with an AHI of 28/hr and she was started on auto CPAP.    She is doing well with her CPAP device and thinks that she has gotten used to it.  She tolerates the nasal pillow mask  but sometimes gets some sores in her nose.  She feels the pressure is adequate.  Since going on CPAP she feels rested in the am and has no significant daytime sleepiness.  She denies any significant mouth or nasal dryness or nasal congestion.  She does not think that he snores.     She is here today for followup and is doing well.  She denies any chest pain or pressure, SOB, DOE, PND, orthopnea, LE edema, dizziness, palpitations or syncope. She is compliant with her meds and is tolerating meds with no SE.     Prior CV studies:   The following studies were reviewed today:  EKG and PAP compliance download  Past Medical History:  Diagnosis Date   Anxiety    Chronic systolic CHF (congestive heart failure) (HCC)    DCM (dilated cardiomyopathy) (HCC)    EF initially 20-25% on echo with cath showing normal coronary arteries and normalized at 55-60% on echo April 2021   HTN (hypertension)    OSA on CPAP    moderate with AHI 28/hr now on auto CPAP   Past Surgical History:  Procedure Laterality Date   CHOLECYSTECTOMY     FRACTURE SURGERY     OOPHORECTOMY Right    OOPHORECTOMY     RIGHT/LEFT HEART CATH AND CORONARY ANGIOGRAPHY N/A 11/18/2019   Procedure:  RIGHT/LEFT HEART CATH AND CORONARY ANGIOGRAPHY;  Surgeon: Dolores Patty, MD;  Location: [facility];  Service: Cardiovascular;  Laterality: N/A;     Current Meds  Medication Sig   atorvastatin (LIPITOR) 40 MG tablet TAKE 1 TABLET DAILY   Ferrous Fumarate (IRON) 18 MG TBCR Take by mouth daily.    gabapentin (NEURONTIN) 100 MG capsule Take 100 mg by mouth 2 (two) times daily. Take 1 tab bid   lamoTRIgine (LAMICTAL) 200 MG tablet Take 350 mg by mouth at bedtime.   LORazepam (ATIVAN) 0.5 MG tablet Take 0.5 mg by mouth daily as needed for anxiety.    Omega-3 Fatty Acids (FISH OIL PO) Take 1 capsule by mouth daily.   [DISCONTINUED] BIDIL 20-37.5 MG tablet TAKE 1 TABLET THREE TIMES A DAY   [DISCONTINUED] carvedilol (COREG) 12.5 MG tablet Take 1 tablet (12.5 mg total) by mouth 2 (two) times daily with a meal. Pt needs to keep upcoming appt in Sept for further refills   [DISCONTINUED] furosemide (LASIX) 20 MG tablet TAKE 1 TABLET DAILY   [DISCONTINUED] sacubitril-valsartan (ENTRESTO) 97-103 MG Take 1 tablet by mouth 2 (two) times daily. Need appointment for further refills   [DISCONTINUED] spironolactone (ALDACTONE) 25 MG tablet TAKE 1 TABLET DAILY     Allergies:  Penicillins   Social History   Tobacco Use   Smoking status: Never   Smokeless tobacco: Never  Substance Use Topics   Alcohol use: No    Alcohol/week: 0.0 standard drinks   Drug use: No     Family Hx: The patient's family history includes Congestive Heart Failure in an other family member; Diabetes in her father; Heart failure in her father.  ROS:   Please see the history of present illness.     All other systems reviewed and are negative.   Labs/Other Tests and Data Reviewed:    Recent Labs: No results found for requested labs within last 8760 hours.   Recent Lipid Panel No results found for: CHOL, TRIG, HDL, CHOLHDL, LDLCALC, LDLDIRECT  Wt Readings from Last 3 Encounters:  07/16/21 229 lb (103.9 kg)   07/20/20 240 lb 9.6 oz (109.1 kg)  05/07/20 240 lb (108.9 kg)     Objective:    Vital Signs:  BP (!) 106/54   Pulse 77   Ht 5\' 7"  (1.702 m)   Wt 229 lb (103.9 kg)   SpO2 98%   BMI 35.87 kg/m    GEN: Well nourished, well developed in no acute distress HEENT: Normal NECK: No JVD; No carotid bruits LYMPHATICS: No lymphadenopathy CARDIAC:RRR, no murmurs, rubs, gallops RESPIRATORY:  Clear to auscultation without rales, wheezing or rhonchi  ABDOMEN: Soft, non-tender, non-distended MUSCULOSKELETAL:  No edema; No deformity  SKIN: Warm and dry NEUROLOGIC:  Alert and oriented x 3 PSYCHIATRIC:  Normal affect     ASSESSMENT & PLAN:    1.  OSA - The patient is tolerating PAP therapy well without any problems. The PAP download performed by his DME was personally reviewed and interpreted by me today and showed an AHI of 3.4/hr on auto PAP cm H2O with 33% compliance in using more than 4 hours nightly.  The patient has been using and benefiting from PAP use and will continue to benefit from therapy.  -I encouraged her to be more compliant with her device -I will change her to a nasal cushion mask since she is getting sores in her nose with the pillow mask   2.  HTN -BP is adequately controlled on exam today -continue prescription drug management with Entresto 97-103mg  BID, Bidil 20-37.5mg  TID, spiro 25mg  daily and Carvedilol 12.5mg  BID>refilled today -check BMET today  3.  NIDCM -likely related to OSA and HTN -EF now 55-60% with normal coronary arteries on cath  -she does not appear volume overloaded on exam today -continue Entresto, spiro and BB  4.  Morbid Obesity -she has lost over 10lbs and has been exercising  Medication Adjustments/Labs and Tests Ordered: Current medicines are reviewed at length with the patient today.  Concerns regarding medicines are outlined above.  Tests Ordered: Orders Placed This Encounter  Procedures   EKG 12-Lead    Medication Changes: Meds  ordered this encounter  Medications   isosorbide-hydrALAZINE (BIDIL) 20-37.5 MG tablet    Sig: Take 1 tablet by mouth 3 (three) times daily.    Dispense:  270 tablet    Refill:  3   carvedilol (COREG) 12.5 MG tablet    Sig: Take 1 tablet (12.5 mg total) by mouth 2 (two) times daily with a meal. Pt needs to keep upcoming appt in Sept for further refills    Dispense:  180 tablet    Refill:  3   furosemide (LASIX) 20 MG tablet    Sig: Take 1 tablet (20 mg  total) by mouth daily.    Dispense:  90 tablet    Refill:  3   sacubitril-valsartan (ENTRESTO) 97-103 MG    Sig: Take 1 tablet by mouth 2 (two) times daily.    Dispense:  180 tablet    Refill:  3   spironolactone (ALDACTONE) 25 MG tablet    Sig: Take 1 tablet (25 mg total) by mouth daily.    Dispense:  90 tablet    Refill:  3     Disposition:  Follow up in 1 year(s)  Signed, Armanda Magic, MD  07/16/2021 8:40 AM    [facility]

## 2021-07-16 NOTE — Addendum Note (Signed)
Addended by: Theresia Majors on: 07/16/2021 08:49 AM   Modules accepted: Orders

## 2021-08-04 ENCOUNTER — Telehealth: Payer: Self-pay | Admitting: *Deleted

## 2021-08-04 ENCOUNTER — Telehealth (HOSPITAL_COMMUNITY): Payer: Self-pay | Admitting: *Deleted

## 2021-08-04 DIAGNOSIS — Z9989 Dependence on other enabling machines and devices: Secondary | ICD-10-CM

## 2021-08-04 DIAGNOSIS — G4733 Obstructive sleep apnea (adult) (pediatric): Secondary | ICD-10-CM

## 2021-08-04 DIAGNOSIS — I5022 Chronic systolic (congestive) heart failure: Secondary | ICD-10-CM

## 2021-08-04 DIAGNOSIS — I10 Essential (primary) hypertension: Secondary | ICD-10-CM

## 2021-08-04 MED ORDER — CARVEDILOL 12.5 MG PO TABS: 12.5000 mg | ORAL_TABLET | Freq: Two times a day (BID) | ORAL | 0 refills | Status: DC

## 2021-08-04 MED ORDER — ATORVASTATIN CALCIUM 40 MG PO TABS: 40.0000 mg | ORAL_TABLET | Freq: Every day | ORAL | 0 refills | Status: DC

## 2021-08-04 MED ORDER — FUROSEMIDE 20 MG PO TABS: 20.0000 mg | ORAL_TABLET | Freq: Every day | ORAL | 0 refills | Status: DC

## 2021-08-04 MED ORDER — ENTRESTO 97-103 MG PO TABS: 1.0000 | ORAL_TABLET | Freq: Two times a day (BID) | ORAL | 0 refills | Status: DC

## 2021-08-04 MED ORDER — FUROSEMIDE 20 MG PO TABS: 20.0000 mg | ORAL_TABLET | Freq: Every day | ORAL | 3 refills | Status: DC

## 2021-08-04 MED ORDER — SPIRONOLACTONE 25 MG PO TABS: 25.0000 mg | ORAL_TABLET | Freq: Every day | ORAL | 3 refills | Status: DC

## 2021-08-04 MED ORDER — ISOSORB DINITRATE-HYDRALAZINE 20-37.5 MG PO TABS: 1.0000 | ORAL_TABLET | Freq: Three times a day (TID) | ORAL | 3 refills | Status: DC

## 2021-08-04 MED ORDER — ATORVASTATIN CALCIUM 40 MG PO TABS: 40.0000 mg | ORAL_TABLET | Freq: Every day | ORAL | 3 refills | Status: DC

## 2021-08-04 MED ORDER — ISOSORB DINITRATE-HYDRALAZINE 20-37.5 MG PO TABS: 1.0000 | ORAL_TABLET | Freq: Three times a day (TID) | ORAL | 0 refills | Status: DC

## 2021-08-04 MED ORDER — SPIRONOLACTONE 25 MG PO TABS: 25.0000 mg | ORAL_TABLET | Freq: Every day | ORAL | 0 refills | Status: DC

## 2021-08-04 MED ORDER — ENTRESTO 97-103 MG PO TABS: 1.0000 | ORAL_TABLET | Freq: Two times a day (BID) | ORAL | 3 refills | Status: DC

## 2021-08-04 MED ORDER — CARVEDILOL 12.5 MG PO TABS: 12.5000 mg | ORAL_TABLET | Freq: Two times a day (BID) | ORAL | 3 refills | Status: DC

## 2021-08-04 NOTE — Telephone Encounter (Signed)
Per dr Mayford Knife, -I will change her to a nasal cushion mask since she is getting sores in her nose with the pillow mask  Per dr Mayford Knife all cardiac medication should be filled going forward. Since I follow her sleep I am happy to follow her gen Cards as well - ok to refill cardiac meds.

## 2021-08-04 NOTE — Telephone Encounter (Signed)
[facility]

## 2021-08-04 NOTE — Telephone Encounter (Signed)
Mask and cpap supplies order has been placed.    Quintella Reichert, MD  Loa Socks, LPN; Reesa Chew, CMA Dr. Gala Romney needs to fill meds as I only see her for sleep - Coralee North please order a nasal pillow mask and supplies ASAP    Message ----- From: Loa Socks, LPN  Sent: 41/58/3094   9:54 AM EDT  To: Quintella Reichert, MD, Reesa Chew, CMA  Subject: FW: C pap mask and Medication                    ----- Message -----  From: [name]  Sent: 08/04/2021   9:34 AM EDT  To: [facility] Triage  Subject: C pap mask and Medication                      Hi I recently had a visit with you and we spoke about my c pap pillars were irritating inside of my nose.You recommended me to try the kind that you use and were going to prescribe me one.[facility] couldn't find my prescription.Will you send them another? I also [facility] or my insurance will not pay for it.   Thank you and have a great day!               Babs Sciara

## 2021-08-04 NOTE — Telephone Encounter (Signed)
-----   Message from Reesa Chew, CMA sent at 08/04/2021  4:40 PM EDT ----- Per dr Mayford Knife, Since I follow her sleep I am happy to follow her gen Cards as well - ok to refill cardiac meds   Please help patient Thanks,  Coralee North

## 2021-08-04 NOTE — Telephone Encounter (Signed)
Pt graduated AHF clinic in 2021 and was referred to f/u with gen cards at that time, referral was placed. Pt seen by Dr Mayford Knife for sleep apnea only, no gen/primary cards, new referral placed. Labs done recently stable, meds sent in with 90 day supply NO REFILLS, my chart message sent to pt as well.

## 2021-08-05 MED ORDER — CARVEDILOL 12.5 MG PO TABS: 12.5000 mg | ORAL_TABLET | Freq: Two times a day (BID) | ORAL | 3 refills | Status: DC

## 2021-08-05 MED ORDER — ATORVASTATIN CALCIUM 40 MG PO TABS: 40.0000 mg | ORAL_TABLET | Freq: Every day | ORAL | 3 refills | Status: DC

## 2021-08-05 MED ORDER — ENTRESTO 97-103 MG PO TABS: 1.0000 | ORAL_TABLET | Freq: Two times a day (BID) | ORAL | 3 refills | Status: DC

## 2021-08-05 MED ORDER — FUROSEMIDE 20 MG PO TABS: 20.0000 mg | ORAL_TABLET | Freq: Every day | ORAL | 3 refills | Status: DC

## 2021-08-05 MED ORDER — SPIRONOLACTONE 25 MG PO TABS: 25.0000 mg | ORAL_TABLET | Freq: Every day | ORAL | 3 refills | Status: DC

## 2021-08-05 MED ORDER — ISOSORB DINITRATE-HYDRALAZINE 20-37.5 MG PO TABS: 1.0000 | ORAL_TABLET | Freq: Three times a day (TID) | ORAL | 3 refills | Status: DC

## 2021-08-05 NOTE — Addendum Note (Signed)
Addended by: Burnetta Sabin on: 08/05/2021 08:59 AM   Modules accepted: Orders

## 2021-08-26 DIAGNOSIS — M25562 Pain in left knee: Secondary | ICD-10-CM

## 2021-08-26 HISTORY — DX: Pain in left knee: M25.562

## 2021-11-02 ENCOUNTER — Other Ambulatory Visit (HOSPITAL_COMMUNITY): Payer: Self-pay | Admitting: Internal Medicine

## 2022-04-07 ENCOUNTER — Other Ambulatory Visit (HOSPITAL_COMMUNITY): Payer: Self-pay | Admitting: Cardiology

## 2022-05-21 ENCOUNTER — Encounter: Payer: Self-pay | Admitting: Cardiology

## 2022-05-23 ENCOUNTER — Telehealth: Payer: Self-pay | Admitting: Cardiology

## 2022-05-23 MED ORDER — ENTRESTO 97-103 MG PO TABS: 1.0000 | ORAL_TABLET | Freq: Two times a day (BID) | ORAL | 0 refills | Status: DC

## 2022-05-23 NOTE — Telephone Encounter (Signed)
Have sent in a [facility] week supply [facility] [address]

## 2022-05-23 NOTE — Telephone Encounter (Signed)
*  STAT* If patient is at the pharmacy, call can be transferred to refill team.   1. Which medications need to be refilled? (please list name of each medication and dose if known) ENTRESTO 97-103 MG  2. Which pharmacy/location (including street and city if local pharmacy) is medication to be sent to? [id number], [place] - [address]  3. Do they need a 30 day or 90 day supply?   [facility] [address], refill from [facility] is on the way but she is out.

## 2022-06-21 ENCOUNTER — Other Ambulatory Visit (HOSPITAL_COMMUNITY): Payer: Self-pay | Admitting: Internal Medicine

## 2022-07-06 ENCOUNTER — Other Ambulatory Visit (HOSPITAL_COMMUNITY): Payer: Self-pay | Admitting: Cardiology

## 2022-07-06 NOTE — Telephone Encounter (Signed)
Pt's pharmacy is requesting a refill on atorvastatin. Dr. Radford Pax sees pt for sleep. Would Dr. Radford Pax like to refill this medication? Please address

## 2022-08-09 ENCOUNTER — Other Ambulatory Visit: Payer: Self-pay

## 2022-08-09 NOTE — Progress Notes (Unsigned)
Office Visit    Patient Name: Lisa Sellers Date of Encounter: 08/10/2022  PCP:  Franki Cabot, MD   [place]  Cardiologist:  Fransico Him, MD  Advanced Practice Provider:  No care team member to display Electrophysiologist:  None   HPI    Lisa Sellers past medical history significant for chronic systolic CHF, NICM (EF initially 2025% and resolved with EF 55 to 60% on echo April 2021), hypertension moderate OSA on CPAP is a 51 y.o. female presents today for follow-up visit.  She was last seen by Dr. Radford Pax 06/2021 was doing well at that time with her CPAP device.  She was tolerating a nasal pillow mask.  Pressure was adequate.  Denies chest pain and shortness of breath.  Compliant with medications.  Today, she is doing well today.  She admits to some noncompliance with her nasal pillow mask.  She wakes up sometimes with it out of her nose.  She believes the flow is too high.  She is asking for a pressure adjustment.  She denies swelling in her legs and has not had any shortness of breath.  She has not had any chest pain.  Blood pressure is very low today 88/52.  She states it has been low for a while.  She is asymptomatic.  She questioned coming off some of her medicines and we discussed the reason why she is taking each medication.  We will continue to adjust her dosage as needed but we advised her to continue all her current medications at this time.  We discussed updating an echocardiogram.  Reports no shortness of breath nor dyspnea on exertion. Reports no chest pain, pressure, or tightness. No edema, orthopnea, PND. Reports no palpitations.    Past Medical History    Past Medical History:  Diagnosis Date   Acute CHF (congestive heart failure) ([place]) 11/15/2019   Acute exacerbation of CHF (congestive heart failure) ([place]) [id number]   Acute systolic CHF (congestive heart failure) (HCC)    Anxiety    Bipolar affective disorder in  remission ([place]) [id number]   Chronic systolic CHF (congestive heart failure) (HCC)    DCM (dilated cardiomyopathy) (HCC)    EF initially 20-25% on echo with cath showing normal coronary arteries and normalized at 55-60% on echo April 2021   Elevated brain natriuretic peptide (BNP) level 11/15/2019   Essential hypertension 05/02/2015   Generalized anxiety disorder 04/27/2015   HTN (hypertension)    Iron deficiency anemia 12/07/2019   Left knee pain 08/26/2021   Obesity, unspecified 01/04/2014   OSA on CPAP    moderate with AHI 28/hr now on auto CPAP   Type 2 diabetes mellitus without complication, without long-term current use of insulin ([place]) 05/05/2020   Vitamin D insufficiency 09/28/2020   Past Surgical History:  Procedure Laterality Date   CHOLECYSTECTOMY     FRACTURE SURGERY     OOPHORECTOMY Right    OOPHORECTOMY     RIGHT/LEFT HEART CATH AND CORONARY ANGIOGRAPHY N/A 11/18/2019   Procedure: RIGHT/LEFT HEART CATH AND CORONARY ANGIOGRAPHY;  Surgeon: Jolaine Artist, MD;  Location: [facility];  Service: Cardiovascular;  Laterality: N/A;    Allergies  Allergies  Allergen Reactions   Penicillins Hives    EKGs/Labs/Other Studies Reviewed:   The following studies were reviewed today:  Echocardiogram 02/06/20 IMPRESSIONS     1. Left ventricular ejection fraction, by estimation, is 55 to 60%. The  left ventricle has normal function. The  left ventricle has no regional  wall motion abnormalities. Left ventricular diastolic parameters are  consistent with Grade I diastolic  dysfunction (impaired relaxation).   2. Right ventricular systolic function is normal. The right ventricular  size is normal.   3. Left atrial size was mildly dilated.   4. The mitral valve is normal in structure. Trivial mitral valve  regurgitation.   5. The aortic valve is normal in structure. Aortic valve regurgitation is  not visualized.   6. The inferior vena cava is normal in size with greater  than 50%  respiratory variability, suggesting right atrial pressure of 3 mmHg.   FINDINGS   Left Ventricle: Left ventricular ejection fraction, by estimation, is 55  to 60%. The left ventricle has normal function. The left ventricle has no  regional wall motion abnormalities. The left ventricular internal cavity  size was normal in size. There is   no left ventricular hypertrophy. Left ventricular diastolic parameters  are consistent with Grade I diastolic dysfunction (impaired relaxation).   Right Ventricle: The right ventricular size is normal. No increase in  right ventricular wall thickness. Right ventricular systolic function is  normal.   Left Atrium: Left atrial size was mildly dilated.   Right Atrium: Right atrial size was normal in size.   Pericardium: There is no evidence of pericardial effusion.   Mitral Valve: The mitral valve is normal in structure. Trivial mitral  valve regurgitation.   Tricuspid Valve: The tricuspid valve is normal in structure. Tricuspid  valve regurgitation is trivial.   Aortic Valve: The aortic valve is normal in structure. Aortic valve  regurgitation is not visualized.   Pulmonic Valve: The pulmonic valve was normal in structure. Pulmonic valve  regurgitation is not visualized.   Aorta: The aortic root and ascending aorta are structurally normal, with  no evidence of dilitation.   Venous: The inferior vena cava is normal in size with greater than 50%  respiratory variability, suggesting right atrial pressure of 3 mmHg.   IAS/Shunts: No atrial level shunt detected by color flow Doppler.  EKG:  EKG is  ordered today.  The ekg ordered today demonstrates normal sinus rhythm, rate 62 bpm  Recent Labs: No results found for requested labs within last 365 days.  Recent Lipid Panel No results found for: "CHOL", "TRIG", "HDL", "CHOLHDL", "VLDL", "LDLCALC", "LDLDIRECT"  Home Medications   Current Meds  Medication Sig   atorvastatin  (LIPITOR) 40 MG tablet Take 40 mg by mouth daily.   buPROPion (WELLBUTRIN XL) 150 MG 24 hr tablet Take 150 mg by mouth every morning.   carvedilol (COREG) 12.5 MG tablet Take 1 tablet (12.5 mg total) by mouth 2 (two) times daily with a meal. Take 1 tablet (12.5 mg total) by mouth 2 (two) times daily with a meal.   Cholecalciferol (D3-1000) 25 MCG (1000 UT) tablet Take 1,000 Units by mouth daily.   Ferrous Fumarate (IRON) 18 MG TBCR Take 1 tablet by mouth daily.   fluticasone (FLONASE) 50 MCG/ACT nasal spray Place 2 sprays into both nostrils daily.   furosemide (LASIX) 20 MG tablet Take 20 mg by mouth daily.   gabapentin (NEURONTIN) 100 MG capsule Take 100 mg by mouth 2 (two) times daily.   isosorbide-hydrALAZINE (BIDIL) 20-37.5 MG tablet Take 1 tablet by mouth 3 (three) times daily.   lamoTRIgine (LAMICTAL) 200 MG tablet Take 350 mg by mouth at bedtime.   lamoTRIgine (LAMICTAL) 25 MG tablet Take 50 mg by mouth daily.   latanoprost (XALATAN)  0.005 % ophthalmic solution    LORazepam (ATIVAN) 0.5 MG tablet Take 0.5 mg by mouth daily as needed for anxiety.    Omega-3 Fatty Acids (FISH OIL PO) Take 1 capsule by mouth daily.   spironolactone (ALDACTONE) 25 MG tablet Take 1 tablet (25 mg total) by mouth daily.   [DISCONTINUED] sacubitril-valsartan (ENTRESTO) 97-103 MG Take 1 tablet by mouth 2 (two) times daily. ENOUGH UNTIL MAIL ORDER COMES IN     Review of Systems      All other systems reviewed and are otherwise negative except as noted above.  Physical Exam    VS:  BP (!) 88/52   Pulse 62   Ht 5\' 7"  (0.321 m)   Wt 228 lb 3.2 oz (103.5 kg)   SpO2 95%   BMI 35.74 kg/m  , BMI Body mass index is 35.74 kg/m.  Wt Readings from Last 3 Encounters:  08/10/22 228 lb 3.2 oz (103.5 kg)  07/16/21 229 lb (103.9 kg)  07/20/20 240 lb 9.6 oz (109.1 kg)     GEN: Well nourished, well developed, in no acute distress. HEENT: normal. Neck: Supple, no JVD, carotid bruits, or masses. Cardiac: RRR, no  murmurs, rubs, or gallops. No clubbing, cyanosis, edema.  Radials/PT 2+ and equal bilaterally.  Respiratory:  Respirations regular and unlabored, clear to auscultation bilaterally. GI: Soft, nontender, nondistended. MS: No deformity or atrophy. Skin: Warm and dry, no rash. Neuro:  Strength and sensation are intact. Psych: Normal affect.  Assessment & Plan    OSA -has been noncompliant with her nasal pillow she states that she wakes up in the middle of the night and has inadvertently moved her nasal cannula down -She is asking for a pressure adjustment on her device -I will send Dr. Radford Pax a message regarding an adjustment  HTN -Hypotensive today, blood pressure 88/52 -We will decrease her dose of Entresto -Asked to take daily blood pressure readings an hour after morning medicines and send me those values  NIDCM -We will plan to update echocardiogram since her last 1 was 2021 -Continue current GDMT: Coreg 12.5 mg twice daily, Lasix 20 mg daily, BiDil 20-37.5 mg, Entresto 49-51 mg twice daily (adjusted today) and spironolactone 25 mg daily -Euvolemic on exam today -Patient denies shortness of breath or lower extremity edema -Daily weights  Morbid obesity -Encourage daily exercise 30 minutes x 5 days a week -Low-sodium diet         Disposition: Follow up 1 year with Fransico Him, MD or APP.  Signed, Elgie Collard, PA-C 08/10/2022, 8:28 AM [facility]

## 2022-08-10 ENCOUNTER — Other Ambulatory Visit (HOSPITAL_COMMUNITY): Payer: Self-pay | Admitting: Cardiology

## 2022-08-10 ENCOUNTER — Ambulatory Visit: Payer: BC Managed Care – PPO | Attending: Physician Assistant | Admitting: Physician Assistant

## 2022-08-10 ENCOUNTER — Encounter: Payer: Self-pay | Admitting: Physician Assistant

## 2022-08-10 VITALS — BP 88/52 | HR 62 | Ht 67.0 in | Wt 228.2 lb

## 2022-08-10 DIAGNOSIS — G4733 Obstructive sleep apnea (adult) (pediatric): Secondary | ICD-10-CM

## 2022-08-10 DIAGNOSIS — I420 Dilated cardiomyopathy: Secondary | ICD-10-CM | POA: Diagnosis not present

## 2022-08-10 DIAGNOSIS — I10 Essential (primary) hypertension: Secondary | ICD-10-CM | POA: Diagnosis not present

## 2022-08-10 MED ORDER — ENTRESTO 49-51 MG PO TABS: 1.0000 | ORAL_TABLET | Freq: Two times a day (BID) | ORAL | 11 refills | Status: DC

## 2022-08-10 NOTE — Patient Instructions (Signed)
Medication Instructions:  1.Decrease entresto to 49-51 mg twice a day *If you need a refill on your cardiac medications before your next appointment, please call your pharmacy*   Lab Work: None If you have labs (blood work) drawn today and your tests are completely normal, you will receive your results only by: [place] (if you have MyChart) OR A paper copy in the mail If you have any lab test that is abnormal or we need to change your treatment, we will call you to review the results.   Testing/Procedures: Your physician has requested that you have an echocardiogram. Echocardiography is a painless test that uses sound waves to create images of your heart. It provides your doctor with information about the size and shape of your heart and how well your heart's chambers and valves are working. This procedure takes approximately one hour. There are no restrictions for this procedure. Please do NOT wear cologne, perfume, aftershave, or lotions (deodorant is allowed). Please arrive 15 minutes prior to your appointment time.    Follow-Up: At [facility], you and your health needs are our priority.  As part of our continuing mission to provide you with exceptional heart care, we have created designated Provider Care Teams.  These Care Teams include your primary Cardiologist (physician) and Advanced Practice Providers (APPs -  Physician Assistants and Nurse Practitioners) who all work together to provide you with the care you need, when you need it.   Your next appointment:   1 year(s)  The format for your next appointment:   In Person  Provider:   Fransico Him, MD    Other Instructions 1.Check your blood pressure daily, one hour after taking your morning medications for the next 2 weeks, keep a log and send [place] the readings through mychart. 2.Weigh yourself every morning after using the restroom, but before breakfast. Call us if you have a weight gain of 2-3 pounds or more  overnight or 5 pounds or more in a week.  Important Information About Sugar

## 2022-08-11 ENCOUNTER — Other Ambulatory Visit: Payer: Self-pay | Admitting: Cardiology

## 2022-08-19 ENCOUNTER — Other Ambulatory Visit: Payer: Self-pay | Admitting: Cardiology

## 2022-08-22 ENCOUNTER — Telehealth: Payer: Self-pay | Admitting: *Deleted

## 2022-08-22 ENCOUNTER — Ambulatory Visit (HOSPITAL_COMMUNITY): Payer: BC Managed Care – PPO | Attending: Physician Assistant

## 2022-08-22 DIAGNOSIS — I420 Dilated cardiomyopathy: Secondary | ICD-10-CM

## 2022-08-22 LAB — ECHOCARDIOGRAM COMPLETE
Area-P 1/2: 3.31 cm2
S' Lateral: 3.3 cm

## 2022-08-22 NOTE — Telephone Encounter (Signed)
Lisa Margarita, MD  Freada Bergeron, CMA Her AHI is actually elevated - please find out what mask she is using

## 2022-08-22 NOTE — Telephone Encounter (Signed)
-----   Message from Sueanne Margarita, MD sent at 08/11/2022 10:09 AM EDT ----- Gae Bon can you get a download on her ----- Message ----- From: Miguel Aschoff Sent: 08/10/2022   8:32 AM EDT To: Sueanne Margarita, MD  Dr. Radford Pax,  Ms. Back is requesting a pressure adjustment on her CPAP.  She states that she wakes up in the middle of the night and it is out of her nose she believes pressure is too high.  If you would be willing to help with this that would be great.  I decreased her Delene Loll today due to her blood pressure being so low.  We have ordered an updated echocardiogram.  Thanks!

## 2022-08-22 NOTE — Telephone Encounter (Signed)
-----   Message -----  From: Sueanne Margarita, MD  Sent: 08/11/2022  10:09 AM EDT  To: Freada Bergeron, CMA; Elgie Collard, PA-C   Gae Bon can you get a download on her   Chrys, [place]  07/20/2022 - 08/18/2022  Patient ID: [id number]  DOB: Dec 24, 1970  Age: 51 years  [facility]  [place] [address] [place]  [place], [place]  Phone: [phone]  Email: [email or web address]  Compliance Report  Usage 07/20/2022 - 08/18/2022  Usage days 29/30 days (97%)  >= 4 hours 17 days (57%)  < 4 hours 12 days (40%)  Usage hours 119 hours 39 minutes  Average usage (total days) 3 hours 59 minutes  Average usage (days used) 4 hours 8 minutes  Median usage (days used) 4 hours 19 minutes  Total used hours (value since last reset - 08/18/2022) 4,139 hours  AirSense 10 AutoSet  Serial number [id number]  Mode AutoSet  Min Pressure 5 cmH2O  Max Pressure 20 cmH2O  EPR Fulltime  EPR level 2  Response Standard  Therapy  Pressure - cmH2O Median: 7.5 95th percentile: 12.7 Maximum: 14.9  Leaks - L/min Median: 0.4 95th percentile: 21.7 Maximum: 32.5  Events per hour AI: 5.0 HI: 0.2 AHI: 5.2  Apnea Index Central: 0.5 Obstructive: 4.3 Unknown: 0.1  RERA Index 0.2  Cheyne-Stokes respiration (average duration per night) 0 minutes (0%)  Usage - hours  Printed on 08/19/2022 - ResMed AirView version [id number] Page 1 of 1

## 2022-08-23 ENCOUNTER — Other Ambulatory Visit: Payer: Self-pay | Admitting: *Deleted

## 2022-08-23 DIAGNOSIS — Q2112 Patent foramen ovale: Secondary | ICD-10-CM

## 2022-09-15 ENCOUNTER — Ambulatory Visit (HOSPITAL_COMMUNITY): Payer: BC Managed Care – PPO | Attending: Physician Assistant

## 2022-09-15 ENCOUNTER — Ambulatory Visit: Payer: BC Managed Care – PPO | Admitting: Physician Assistant

## 2022-09-15 DIAGNOSIS — Q2112 Patent foramen ovale: Secondary | ICD-10-CM | POA: Insufficient documentation

## 2022-09-15 LAB — ECHOCARDIOGRAM COMPLETE BUBBLE STUDY
Area-P 1/2: 3.85 cm2
S' Lateral: 3.6 cm

## 2022-09-15 NOTE — Progress Notes (Unsigned)
Patient experienced discomfort in right arm upon removal of IV. (DOD) Dr. Ladona Ridgel consulted with patient. Patient was okay to be discharged.

## 2022-09-21 NOTE — Progress Notes (Unsigned)
Office Visit    Patient Name: Lisa Sellers Date of Encounter: 09/22/2022  PCP:  Franki Cabot, MD   [place]  Cardiologist:  Fransico Him, MD  Advanced Practice Provider:  No care team member to display Electrophysiologist:  None   HPI    Lisa Sellers past medical history significant for chronic systolic CHF, NICM (EF initially 2025% and resolved with EF 55 to 60% on echo April 2021), hypertension moderate OSA on CPAP is a 51 y.o. female presents today for follow-up visit.  She was last seen by Dr. Radford Pax 06/2021 was doing well at that time with her CPAP device.  She was tolerating a nasal pillow mask.  Pressure was adequate.  Denies chest pain and shortness of breath.  Compliant with medications.  She was last seen by me 08/10/22 and was doing well at that time.  She admited to some noncompliance with her nasal pillow mask.  She wakes up sometimes with it out of her nose.  She believes the flow is too high.  She is asking for a pressure adjustment.  She denies swelling in her legs and has not had any shortness of breath.  She has not had any chest pain.  Blood pressure is very low today 88/52.  She states it has been low for a while.  She is asymptomatic.  She questioned coming off some of her medicines and we discussed the reason why she is taking each medication.  We will continue to adjust her dosage as needed but we advised her to continue all her current medications at this time.  We discussed updating an echocardiogram.  Today, she comes in to review her echocardiogram.  She has questions regarding her bubble study.  No chest pain.  Overall feeling well.  No lower extremity edema or significant shortness of breath.  We reviewed her echocardiogram in detail.  Normal LVEF 60 to 65% with trivial leak of mitral valve and aortic valve.  No evidence of PFO.  Overall, great result.  She tells me that she is not heard from me now regarding her  CPAP titration.  I will reach out to her today.  Reports no shortness of breath nor dyspnea on exertion. Reports no chest pain, pressure, or tightness. No edema, orthopnea, PND. Reports no palpitations.    Past Medical History    Past Medical History:  Diagnosis Date   Acute CHF (congestive heart failure) ([place]) 11/15/2019   Acute exacerbation of CHF (congestive heart failure) ([place]) 123XX123   Acute systolic CHF (congestive heart failure) ([facility])    Anxiety    Bipolar affective disorder in remission ([place]) [id number]   Chronic systolic CHF (congestive heart failure) ([facility])    DCM (dilated cardiomyopathy) ([facility])    EF initially 20-25% on echo with cath showing normal coronary arteries and normalized at 55-60% on echo April 2021   Elevated brain natriuretic peptide (BNP) level 11/15/2019   Essential hypertension 05/02/2015   Generalized anxiety disorder 04/27/2015   HTN (hypertension)    Iron deficiency anemia 12/07/2019   Left knee pain 08/26/2021   Obesity, unspecified 01/04/2014   OSA on CPAP    moderate with AHI 28/hr now on auto CPAP   Type 2 diabetes mellitus without complication, without long-term current use of insulin ([place]) 05/05/2020   Vitamin D insufficiency 09/28/2020   Past Surgical History:  Procedure Laterality Date   CHOLECYSTECTOMY     FRACTURE SURGERY     OOPHORECTOMY  Right    OOPHORECTOMY     RIGHT/LEFT HEART CATH AND CORONARY ANGIOGRAPHY N/A 11/18/2019   Procedure: RIGHT/LEFT HEART CATH AND CORONARY ANGIOGRAPHY;  Surgeon: Dolores Patty, MD;  Location: [facility]ASIVE CV [facility];  Service: Cardiovascular;  Laterality: N/A;    Allergies  Allergies  Allergen Reactions   Penicillins Hives    EKGs/Labs/Other Studies Reviewed:   The following studies were reviewed today:  Echocardiogram 02/06/20 IMPRESSIONS     1. Left ventricular ejection fraction, by estimation, is 55 to 60%. The  left ventricle has normal function. The left ventricle has no regional  wall motion  abnormalities. Left ventricular diastolic parameters are  consistent with Grade I diastolic  dysfunction (impaired relaxation).   2. Right ventricular systolic function is normal. The right ventricular  size is normal.   3. Left atrial size was mildly dilated.   4. The mitral valve is normal in structure. Trivial mitral valve  regurgitation.   5. The aortic valve is normal in structure. Aortic valve regurgitation is  not visualized.   6. The inferior vena cava is normal in size with greater than 50%  respiratory variability, suggesting right atrial pressure of 3 mmHg.   FINDINGS   Left Ventricle: Left ventricular ejection fraction, by estimation, is 55  to 60%. The left ventricle has normal function. The left ventricle has no  regional wall motion abnormalities. The left ventricular internal cavity  size was normal in size. There is   no left ventricular hypertrophy. Left ventricular diastolic parameters  are consistent with Grade I diastolic dysfunction (impaired relaxation).   Right Ventricle: The right ventricular size is normal. No increase in  right ventricular wall thickness. Right ventricular systolic function is  normal.   Left Atrium: Left atrial size was mildly dilated.   Right Atrium: Right atrial size was normal in size.   Pericardium: There is no evidence of pericardial effusion.   Mitral Valve: The mitral valve is normal in structure. Trivial mitral  valve regurgitation.   Tricuspid Valve: The tricuspid valve is normal in structure. Tricuspid  valve regurgitation is trivial.   Aortic Valve: The aortic valve is normal in structure. Aortic valve  regurgitation is not visualized.   Pulmonic Valve: The pulmonic valve was normal in structure. Pulmonic valve  regurgitation is not visualized.   Aorta: The aortic root and ascending aorta are structurally normal, with  no evidence of dilitation.   Venous: The inferior vena cava is normal in size with greater than  50%  respiratory variability, suggesting right atrial pressure of 3 mmHg.   IAS/Shunts: No atrial level shunt detected by color flow Doppler.  EKG:  EKG is  ordered today.  The ekg ordered today demonstrates normal sinus rhythm, rate 62 bpm  Recent Labs: No results found for requested labs within last 365 days.  Recent Lipid Panel No results found for: "CHOL", "TRIG", "HDL", "CHOLHDL", "VLDL", "LDLCALC", "LDLDIRECT"  Home Medications   Current Meds  Medication Sig   atorvastatin (LIPITOR) 40 MG tablet Take 40 mg by mouth daily.   buPROPion (WELLBUTRIN XL) 150 MG 24 hr tablet Take 150 mg by mouth every morning.   carvedilol (COREG) 12.5 MG tablet TAKE 1 TABLET TWICE A DAY WITH MEALS   Cholecalciferol (D3-1000) 25 MCG (1000 UT) tablet Take 1,000 Units by mouth daily.   Ferrous Fumarate (IRON) 18 MG TBCR Take 1 tablet by mouth daily.   fluticasone (FLONASE) 50 MCG/ACT nasal spray Place 2 sprays into both nostrils daily.  furosemide (LASIX) 20 MG tablet Take 1 tablet (20 mg total) by mouth daily.   gabapentin (NEURONTIN) 100 MG capsule Take 100 mg by mouth 2 (two) times daily.   isosorbide-hydrALAZINE (BIDIL) 20-37.5 MG tablet TAKE 1 TABLET THREE TIMES A DAY   lamoTRIgine (LAMICTAL) 200 MG tablet Take 350 mg by mouth at bedtime.   lamoTRIgine (LAMICTAL) 25 MG tablet Take 25 mg by mouth daily.   latanoprost (XALATAN) 0.005 % ophthalmic solution    LORazepam (ATIVAN) 0.5 MG tablet Take 0.5 mg by mouth daily as needed for anxiety.    Omega-3 Fatty Acids (FISH OIL PO) Take 1 capsule by mouth daily.   sacubitril-valsartan (ENTRESTO) 49-51 MG Take 1 tablet by mouth 2 (two) times daily.   spironolactone (ALDACTONE) 25 MG tablet TAKE 1 TABLET DAILY     Review of Systems      All other systems reviewed and are otherwise negative except as noted above.  Physical Exam    VS:  BP 130/70   Pulse 74   Ht 5\' 7"  (1.702 m)   Wt 238 lb 12.8 oz (108.3 kg)   SpO2 94%   BMI 37.40 kg/m  , BMI  Body mass index is 37.4 kg/m.  Wt Readings from Last 3 Encounters:  09/22/22 238 lb 12.8 oz (108.3 kg)  08/10/22 228 lb 3.2 oz (103.5 kg)  07/16/21 229 lb (103.9 kg)     GEN: Well nourished, well developed, in no acute distress. HEENT: normal. Neck: Supple, no JVD, carotid bruits, or masses. Cardiac: RRR, no murmurs, rubs, or gallops. No clubbing, cyanosis, edema.  Radials/PT 2+ and equal bilaterally.  Respiratory:  Respirations regular and unlabored, clear to auscultation bilaterally. GI: Soft, nontender, nondistended. MS: No deformity or atrophy. Skin: Warm and dry, no rash. Neuro:  Strength and sensation are intact. Psych: Normal affect.  Assessment & Plan    OSA -has been noncompliant with her nasal pillow she states that she wakes up in the middle of the night due to too much pressure and has inadvertently moved her nasal cannula down -She is asking for a pressure adjustment on her device -I will send Gershon Cull a message regarding an adjustment  HTN -BP much improved today -continue current dose of  Entresto  NIDCM -echo 11/30 showed normal LVEF 60-65%, trivial MR and trivial AI -Continue current GDMT: Coreg 12.5 mg twice daily, Lasix 20 mg daily, BiDil 20-37.5 mg, Entresto 49-51 mg twice daily (adjusted today) and spironolactone 25 mg daily -Euvolemic on exam today -Patient denies shortness of breath or lower extremity edema -Daily weights  Morbid obesity -Encourage daily exercise 30 minutes x 5 days a week -Low-sodium diet         Disposition: Follow up 1 year with Fransico Him, MD or APP.  Signed, Elgie Collard, PA-C 09/22/2022, 8:44 AM [place]

## 2022-09-22 ENCOUNTER — Encounter: Payer: Self-pay | Admitting: Physician Assistant

## 2022-09-22 ENCOUNTER — Ambulatory Visit: Payer: BC Managed Care – PPO | Attending: Physician Assistant | Admitting: Physician Assistant

## 2022-09-22 VITALS — BP 130/70 | HR 74 | Ht 67.0 in | Wt 238.8 lb

## 2022-09-22 DIAGNOSIS — G4733 Obstructive sleep apnea (adult) (pediatric): Secondary | ICD-10-CM | POA: Diagnosis not present

## 2022-09-22 DIAGNOSIS — I428 Other cardiomyopathies: Secondary | ICD-10-CM | POA: Diagnosis not present

## 2022-09-22 DIAGNOSIS — I10 Essential (primary) hypertension: Secondary | ICD-10-CM | POA: Diagnosis not present

## 2022-09-22 NOTE — Patient Instructions (Signed)
Medication Instructions:  Your physician recommends that you continue on your current medications as directed. Please refer to the Current Medication list given to you today.  *If you need a refill on your cardiac medications before your next appointment, please call your pharmacy*   Lab Work: None  If you have labs (blood work) drawn today and your tests are completely normal, you will receive your results only by: MyChart Message (if you have MyChart) OR A paper copy in the mail If you have any lab test that is abnormal or we need to change your treatment, we will call you to review the results.   Follow-Up: At [facility], you and your health needs are our priority.  As part of our continuing mission to provide you with exceptional heart care, we have created designated Provider Care Teams.  These Care Teams include your primary Cardiologist (physician) and Advanced Practice Providers (APPs -  Physician Assistants and Nurse Practitioners) who all work together to provide you with the care you need, when you need it.   Your next appointment:   1 year(s)  The format for your next appointment:   In Person  Provider:   Traci Turner, MD      Important Information About Sugar        

## 2022-09-27 ENCOUNTER — Other Ambulatory Visit: Payer: Self-pay

## 2022-09-27 MED ORDER — FUROSEMIDE 20 MG PO TABS: 20.0000 mg | ORAL_TABLET | Freq: Every day | ORAL | 0 refills | Status: DC

## 2022-09-27 MED ORDER — FUROSEMIDE 20 MG PO TABS: 20.0000 mg | ORAL_TABLET | Freq: Every day | ORAL | 3 refills | Status: DC

## 2022-09-27 NOTE — Telephone Encounter (Signed)
Pt's medication was sent to pt's pharmacy as requested. Confirmation received.  °

## 2022-09-27 NOTE — Telephone Encounter (Signed)
Patient called to say she uses nasal pillows  Patient says she feels like her pressure is better and her pressure is not as bad as it was.  Ambera, Fedele 08/27/2022 - 09/25/2022 Patient ID: [id number] DOB: Aug 12, 1971 Age: 51 years [facility] [place], [place] [address] [place], [address] Phone: [phone] Email: [email or web address] Compliance Report Usage 08/27/2022 - 09/25/2022 Usage days 25/30 days (83%) >= 4 hours 17 days (57%) < 4 hours 8 days (27%) Usage hours 126 hours 51 minutes Average usage (total days) 4 hours 14 minutes Average usage (days used) 5 hours 4 minutes Median usage (days used) 5 hours 5 minutes Total used hours (value since last reset - 09/25/2022) 4,295 hours AirSense 10 AutoSet Serial number [id number] Mode AutoSet Min Pressure 5 cmH2O Max Pressure 20 cmH2O EPR Fulltime EPR level 2 Response Standard Therapy Pressure - cmH2O Median: 7.3 95th percentile: 12.6 Maximum: 14.7 Leaks - L/min Median: 0.0 95th percentile: 19.8 Maximum: 32.0 Events per hour AI: 3.3 HI: 0.1 AHI: 3.4 Apnea Index Central: 0.5 Obstructive: 2.7 Unknown: 0.1 RERA Index 0.3 Cheyne-Stokes respiration (average duration per night) 0 minutes (0%) Usage - hours Printed on 09/27/2022 - ResMed AirView version [id number] Page 1 of 1

## 2022-10-08 ENCOUNTER — Other Ambulatory Visit: Payer: Self-pay | Admitting: Physician Assistant

## 2022-10-08 MED ORDER — ENTRESTO 49-51 MG PO TABS: 1.0000 | ORAL_TABLET | Freq: Two times a day (BID) | ORAL | 3 refills | Status: DC

## 2023-07-03 ENCOUNTER — Other Ambulatory Visit (HOSPITAL_COMMUNITY): Payer: Self-pay | Admitting: Cardiology

## 2023-08-14 ENCOUNTER — Other Ambulatory Visit: Payer: Self-pay | Admitting: Cardiology

## 2023-08-21 ENCOUNTER — Other Ambulatory Visit: Payer: Self-pay | Admitting: Cardiology

## 2023-10-02 ENCOUNTER — Other Ambulatory Visit: Payer: Self-pay | Admitting: Cardiology

## 2023-10-02 ENCOUNTER — Encounter (HOSPITAL_BASED_OUTPATIENT_CLINIC_OR_DEPARTMENT_OTHER): Payer: Self-pay | Admitting: Cardiology

## 2023-10-02 MED ORDER — ENTRESTO 49-51 MG PO TABS: 1.0000 | ORAL_TABLET | Freq: Two times a day (BID) | ORAL | 1 refills | Status: DC

## 2023-10-10 ENCOUNTER — Other Ambulatory Visit: Payer: Self-pay | Admitting: Cardiology

## 2023-11-02 ENCOUNTER — Other Ambulatory Visit: Payer: Self-pay | Admitting: Cardiology

## 2023-12-08 ENCOUNTER — Other Ambulatory Visit (HOSPITAL_BASED_OUTPATIENT_CLINIC_OR_DEPARTMENT_OTHER): Payer: Self-pay | Admitting: Cardiology

## 2023-12-11 MED ORDER — SPIRONOLACTONE 25 MG PO TABS: 25.0000 mg | ORAL_TABLET | Freq: Every day | ORAL | 0 refills | Status: DC

## 2023-12-12 ENCOUNTER — Other Ambulatory Visit (HOSPITAL_BASED_OUTPATIENT_CLINIC_OR_DEPARTMENT_OTHER): Payer: Self-pay | Admitting: Cardiology

## 2023-12-13 ENCOUNTER — Other Ambulatory Visit: Payer: Self-pay

## 2023-12-13 MED ORDER — SPIRONOLACTONE 25 MG PO TABS
25.0000 mg | ORAL_TABLET | Freq: Every day | ORAL | 0 refills | Status: DC
Start: 2023-12-13 — End: 2023-12-20

## 2023-12-19 NOTE — Progress Notes (Unsigned)
 Office Visit    Patient Name: Lisa Sellers Date of Encounter: 12/20/2023  PCP:  Lisa Gosselin, MD   [facility]  Cardiologist:  Armanda Magic, MD  Advanced Practice Provider:  No care team member to display Electrophysiologist:  None   HPI    [name] past medical history significant for chronic systolic CHF, NICM (EF initially 2025% and resolved with EF 55 to 60% on echo April 2021), hypertension moderate OSA on CPAP is a 53 y.o. female presents today for follow-up visit.  She was last seen by Dr. Mayford Knife 06/2021 was doing well at that time with her CPAP device.  She was tolerating a nasal pillow mask.  Pressure was adequate.  Denies chest pain and shortness of breath.  Compliant with medications.  She was last seen by me 08/10/22 and was doing well at that time.  She admited to some noncompliance with her nasal pillow mask.  She wakes up sometimes with it out of her nose.  She believes the flow is too high.  She is asking for a pressure adjustment.  She denies swelling in her legs and has not had any shortness of breath.  She has not had any chest pain.  Blood pressure is very low today 88/52.  She states it has been low for a while.  She is asymptomatic.  She questioned coming off some of her medicines and we discussed the reason why she is taking each medication.  We will continue to adjust her dosage as needed but we advised her to continue all her current medications at this time.  We discussed updating an echocardiogram.  I saw her 09/22/22, she comes in to review her echocardiogram.  She has questions regarding her bubble study.  No chest pain.  Overall feeling well.  No lower extremity edema or significant shortness of breath.  We reviewed her echocardiogram in detail.  Normal LVEF 60 to 65% with trivial leak of mitral valve and aortic valve.  No evidence of PFO.  Overall, great result.  She tells me that she is not heard from me now  regarding her CPAP titration.  I will reach out to her today.  Reports no shortness of breath nor dyspnea on exertion. Reports no chest pain, pressure, or tightness. No edema, orthopnea, PND. Reports no palpitations.   Today, she presents with a history of hypertension and sleep apnea,  for a follow-up visit after a medication adjustment. She reports that her blood pressure has improved since the adjustment, with a current reading of 116/60 and a heart rate of 79. She denies any side effects from the medication, such as aches and pains. She takes 20mg  of Lasix daily and denies any swelling.  The patient also reports issues with vitamin B12 absorption, for which she has been receiving injections.  Regarding her sleep apnea, the patient admits to not consistently using her CPAP machine, often removing it during the night. She reports that the machine feels too strong and she has communicated this to her sleep specialist. She expresses a desire to improve her compliance with the CPAP machine, acknowledging that it improves her sleep and reduces strain on her heart  Reports no shortness of breath nor dyspnea on exertion. Reports no chest pain, pressure, or tightness. No edema, orthopnea, PND. Reports no palpitations.   Discussed the use of AI scribe software for clinical note transcription with the patient, who gave verbal consent to proceed.  Past Medical  History    Past Medical History:  Diagnosis Date   Acute CHF (congestive heart failure) (HCC) 11/15/2019   Acute exacerbation of CHF (congestive heart failure) (HCC) 11/15/2019   Acute systolic CHF (congestive heart failure) (HCC)    Anxiety    Bipolar affective disorder in remission (HCC) 01/01/2016   Chronic systolic CHF (congestive heart failure) (HCC)    DCM (dilated cardiomyopathy) (HCC)    EF initially 20-25% on echo with cath showing normal coronary arteries and normalized at 55-60% on echo April 2021   Elevated brain natriuretic peptide  (BNP) level 11/15/2019   Essential hypertension 05/02/2015   Generalized anxiety disorder 04/27/2015   HTN (hypertension)    Iron deficiency anemia 12/07/2019   Left knee pain 08/26/2021   Obesity, unspecified 01/04/2014   OSA on CPAP    moderate with AHI 28/hr now on auto CPAP   Type 2 diabetes mellitus without complication, without long-term current use of insulin (HCC) 05/05/2020   Vitamin D insufficiency 09/28/2020   Past Surgical History:  Procedure Laterality Date   CHOLECYSTECTOMY     FRACTURE SURGERY     OOPHORECTOMY Right    OOPHORECTOMY     RIGHT/LEFT HEART CATH AND CORONARY ANGIOGRAPHY N/A 11/18/2019   Procedure: RIGHT/LEFT HEART CATH AND CORONARY ANGIOGRAPHY;  Surgeon: Dolores Patty, MD;  Location: [facility];  Service: Cardiovascular;  Laterality: N/A;    Allergies  Allergies  Allergen Reactions   Penicillins Hives    EKGs/Labs/Other Studies Reviewed:   The following studies were reviewed today:  Echocardiogram 02/06/20 IMPRESSIONS     1. Left ventricular ejection fraction, by estimation, is 55 to 60%. The  left ventricle has normal function. The left ventricle has no regional  wall motion abnormalities. Left ventricular diastolic parameters are  consistent with Grade I diastolic  dysfunction (impaired relaxation).   2. Right ventricular systolic function is normal. The right ventricular  size is normal.   3. Left atrial size was mildly dilated.   4. The mitral valve is normal in structure. Trivial mitral valve  regurgitation.   5. The aortic valve is normal in structure. Aortic valve regurgitation is  not visualized.   6. The inferior vena cava is normal in size with greater than 50%  respiratory variability, suggesting right atrial pressure of 3 mmHg.   FINDINGS   Left Ventricle: Left ventricular ejection fraction, by estimation, is 55  to 60%. The left ventricle has normal function. The left ventricle has no  regional wall motion  abnormalities. The left ventricular internal cavity  size was normal in size. There is   no left ventricular hypertrophy. Left ventricular diastolic parameters  are consistent with Grade I diastolic dysfunction (impaired relaxation).   Right Ventricle: The right ventricular size is normal. No increase in  right ventricular wall thickness. Right ventricular systolic function is  normal.   Left Atrium: Left atrial size was mildly dilated.   Right Atrium: Right atrial size was normal in size.   Pericardium: There is no evidence of pericardial effusion.   Mitral Valve: The mitral valve is normal in structure. Trivial mitral  valve regurgitation.   Tricuspid Valve: The tricuspid valve is normal in structure. Tricuspid  valve regurgitation is trivial.   Aortic Valve: The aortic valve is normal in structure. Aortic valve  regurgitation is not visualized.   Pulmonic Valve: The pulmonic valve was normal in structure. Pulmonic valve  regurgitation is not visualized.   Aorta: The aortic root and ascending  aorta are structurally normal, with  no evidence of dilitation.   Venous: The inferior vena cava is normal in size with greater than 50%  respiratory variability, suggesting right atrial pressure of 3 mmHg.   IAS/Shunts: No atrial level shunt detected by color flow Doppler.  EKG:  EKG is  ordered today.  The ekg ordered today demonstrates normal sinus rhythm, rate 62 bpm  Recent Labs: No results found for requested labs within last 365 days.  Recent Lipid Panel No results found for: "CHOL", "TRIG", "HDL", "CHOLHDL", "VLDL", "LDLCALC", "LDLDIRECT"  Home Medications   Current Meds  Medication Sig   buPROPion (WELLBUTRIN XL) 150 MG 24 hr tablet Take 150 mg by mouth every morning.   cyanocobalamin (VITAMIN B12) 1000 MCG tablet Take 1,000 mcg by mouth daily.   fluticasone (FLONASE) 50 MCG/ACT nasal spray Place 2 sprays into both nostrils daily.   gabapentin (NEURONTIN) 100 MG  capsule Take 100 mg by mouth 2 (two) times daily.   lamoTRIgine (LAMICTAL) 200 MG tablet Take 200 mg by mouth at bedtime.   latanoprost (XALATAN) 0.005 % ophthalmic solution    LORazepam (ATIVAN) 0.5 MG tablet Take 0.5 mg by mouth daily as needed for anxiety.    Omega-3 Fatty Acids (FISH OIL PO) Take 1 capsule by mouth daily.   Semaglutide, 2 MG/DOSE, 8 MG/3ML SOPN Inject 2 mg into the skin once a week.   [DISCONTINUED] atorvastatin (LIPITOR) 40 MG tablet TAKE 1 TABLET DAILY   [DISCONTINUED] carvedilol (COREG) 12.5 MG tablet TAKE 1 TABLET TWICE A DAY WITH MEALS   [DISCONTINUED] furosemide (LASIX) 20 MG tablet Take 1 tablet (20 mg total) by mouth daily.   [DISCONTINUED] isosorbide-hydrALAZINE (BIDIL) 20-37.5 MG tablet TAKE 1 TABLET THREE TIMES A DAY   [DISCONTINUED] sacubitril-valsartan (ENTRESTO) 49-51 MG Take 1 tablet by mouth 2 (two) times daily.   [DISCONTINUED] spironolactone (ALDACTONE) 25 MG tablet Take 1 tablet (25 mg total) by mouth daily.     Review of Systems      All other systems reviewed and are otherwise negative except as noted above.  Physical Exam    VS:  BP 116/60   Pulse 79   Ht 5\' 7"  (1.702 m)   Wt 233 lb 9.6 oz (106 kg)   SpO2 94%   BMI 36.59 kg/m  , BMI Body mass index is 36.59 kg/m.  Wt Readings from Last 3 Encounters:  12/20/23 233 lb 9.6 oz (106 kg)  09/22/22 238 lb 12.8 oz (108.3 kg)  08/10/22 228 lb 3.2 oz (103.5 kg)     GEN: Well nourished, well developed, in no acute distress. HEENT: normal. Neck: Supple, no JVD, carotid bruits, or masses. Cardiac: RRR, no murmurs, rubs, or gallops. No clubbing, cyanosis, edema.  Radials/PT 2+ and equal bilaterally.  Respiratory:  Respirations regular and unlabored, clear to auscultation bilaterally. GI: Soft, nontender, nondistended. MS: No deformity or atrophy. Skin: Warm and dry, no rash. Neuro:  Strength and sensation are intact. Psych: Normal affect.  Assessment & Plan    Hypertension Hypertension is  well-controlled with current medication. Blood pressure is 116/60 mmHg. No side effects reported. - Continue current antihypertensive medication regimen.  Hyperlipidemia LDL at 45 mg/dL, below target. Triglycerides normal. - Continue current lipid management plan. - Schedule follow-up lipid panel in May.  Sleep Apnea Non-compliance with CPAP due to mask discomfort. Improved sleep quality with use. Discussed CPAP importance and risks of non-compliance. Explored alternative options, no interest in surgery. - Encourage consistent CPAP use. -  Offer to adjust CPAP settings or explore different mask options if needed.  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 Receiving B12 injections. Endoscopy scheduled to investigate cause. - Proceed with scheduled endoscopy on March 14. No further testing needed from a cardiac standpoint  NIDCM -echo 11/30 showed normal LVEF 60-65%, trivial MR and trivial AI -Continue current GDMT: Coreg 12.5 mg twice daily, Lasix 20 mg daily, BiDil 20-37.5 mg, Entresto 49-51 mg twice daily (adjusted today) and spironolactone 25 mg daily -Euvolemic on exam today -Patient denies shortness of breath or lower extremity edema -Daily weights     Disposition: Follow up 1 year with Armanda Magic, MD or APP.  Signed, Sharlene Dory, PA-C 12/20/2023, 10:00 AM [facility]

## 2023-12-20 ENCOUNTER — Ambulatory Visit: Payer: BC Managed Care – PPO | Attending: Physician Assistant | Admitting: Physician Assistant

## 2023-12-20 ENCOUNTER — Encounter: Payer: Self-pay | Admitting: Physician Assistant

## 2023-12-20 VITALS — BP 116/60 | HR 79 | Ht 67.0 in | Wt 233.6 lb

## 2023-12-20 DIAGNOSIS — I10 Essential (primary) hypertension: Secondary | ICD-10-CM

## 2023-12-20 DIAGNOSIS — G4733 Obstructive sleep apnea (adult) (pediatric): Secondary | ICD-10-CM | POA: Diagnosis not present

## 2023-12-20 DIAGNOSIS — I428 Other cardiomyopathies: Secondary | ICD-10-CM

## 2023-12-20 MED ORDER — ISOSORB DINITRATE-HYDRALAZINE 20-37.5 MG PO TABS: 1.0000 | ORAL_TABLET | Freq: Three times a day (TID) | ORAL | 3 refills | Status: AC

## 2023-12-20 MED ORDER — CARVEDILOL 12.5 MG PO TABS: 12.5000 mg | ORAL_TABLET | Freq: Two times a day (BID) | ORAL | 3 refills | Status: DC

## 2023-12-20 MED ORDER — SACUBITRIL-VALSARTAN 49-51 MG PO TABS: 1.0000 | ORAL_TABLET | Freq: Two times a day (BID) | ORAL | 3 refills | Status: DC

## 2023-12-20 MED ORDER — FUROSEMIDE 20 MG PO TABS: 20.0000 mg | ORAL_TABLET | Freq: Every day | ORAL | 3 refills | Status: AC

## 2023-12-20 MED ORDER — SPIRONOLACTONE 25 MG PO TABS: 25.0000 mg | ORAL_TABLET | Freq: Every day | ORAL | 3 refills | Status: AC

## 2023-12-20 MED ORDER — ATORVASTATIN CALCIUM 40 MG PO TABS: 40.0000 mg | ORAL_TABLET | Freq: Every day | ORAL | 3 refills | Status: AC

## 2023-12-20 NOTE — Patient Instructions (Signed)
 Medication Instructions:    Your physician recommends that you continue on your current medications as directed. Please refer to the Current Medication list given to you today.  *If you need a refill on your cardiac medications before your next appointment, please call your pharmacy*   Lab Work:   MAKE SURE IN MAY TO GET LABS  WITH (PRIMARY DOCTOR)   If you have labs (blood work) drawn today and your tests are completely normal, you will receive your results only by: MyChart Message (if you have MyChart) OR A paper copy in the mail If you have any lab test that is abnormal or we need to change your treatment, we will call you to review the results.   Testing/Procedures: NONE ORDERED  TODAY    Follow-Up: At [facility], you and your health needs are our priority.  As part of our continuing mission to provide you with exceptional heart care, we have created designated Provider Care Teams.  These Care Teams include your primary Cardiologist (physician) and Advanced Practice Providers (APPs -  Physician Assistants and Nurse Practitioners) who all work together to provide you with the care you need, when you need it.  We recommend signing up for the patient portal called "MyChart".  Sign up information is provided on this After Visit Summary.  MyChart is used to connect with patients for Virtual Visits (Telemedicine).  Patients are able to view lab/test results, encounter notes, upcoming appointments, etc.  Non-urgent messages can be sent to your provider as well.   To learn more about what you can do with MyChart, go to [email or web address].    Your next appointment:   1 year(s)  Provider:   Armanda Magic, MD     Other Instructions    1st Floor: - Lobby - Registration  - Pharmacy  - Lab - Cafe  2nd Floor: - PV Lab - Diagnostic Testing (echo, CT, nuclear med)  3rd Floor: - Vacant  4th Floor: - TCTS (cardiothoracic surgery) - AFib Clinic - Structural Heart  Clinic - Vascular Surgery  - Vascular Ultrasound  5th Floor: - HeartCare Cardiology (general and EP) - Clinical Pharmacy for coumadin, hypertension, lipid, weight-loss medications, and med management appointments    Valet parking services will be available as well.

## 2023-12-29 ENCOUNTER — Ambulatory Visit (INDEPENDENT_AMBULATORY_CARE_PROVIDER_SITE_OTHER)

## 2023-12-29 DIAGNOSIS — K2950 Unspecified chronic gastritis without bleeding: Secondary | ICD-10-CM | POA: Diagnosis present

## 2023-12-29 DIAGNOSIS — K31A13 Gastric intestinal metaplasia without dysplasia, involving the fundus: Secondary | ICD-10-CM | POA: Diagnosis not present

## 2023-12-29 DIAGNOSIS — K31A11 Gastric intestinal metaplasia without dysplasia, involving the antrum: Secondary | ICD-10-CM | POA: Diagnosis not present

## 2024-04-20 ENCOUNTER — Other Ambulatory Visit (HOSPITAL_BASED_OUTPATIENT_CLINIC_OR_DEPARTMENT_OTHER): Payer: Self-pay | Admitting: Cardiology

## 2024-08-12 ENCOUNTER — Other Ambulatory Visit: Payer: Self-pay | Admitting: Cardiology

## 2024-09-16 ENCOUNTER — Other Ambulatory Visit: Payer: Self-pay | Admitting: Cardiology

## 2024-09-20 MED ORDER — SACUBITRIL-VALSARTAN 49-51 MG PO TABS: 1.0000 | ORAL_TABLET | Freq: Two times a day (BID) | ORAL | 0 refills | Status: AC

## 2025-03-04 ENCOUNTER — Ambulatory Visit: Admitting: Cardiology
